# Patient Record
Sex: Male | Born: 1990 | Race: Black or African American | Hispanic: No | Marital: Single | Smoking: Never smoker
Health system: Southern US, Community
[De-identification: ages and names within clinical notes are randomized; demographics above are authoritative.]

## PROBLEM LIST (undated history)

## (undated) DIAGNOSIS — J45909 Unspecified asthma, uncomplicated: Secondary | ICD-10-CM

## (undated) DIAGNOSIS — R51 Headache: Secondary | ICD-10-CM

## (undated) DIAGNOSIS — M549 Dorsalgia, unspecified: Secondary | ICD-10-CM

## (undated) DIAGNOSIS — F319 Bipolar disorder, unspecified: Secondary | ICD-10-CM

## (undated) DIAGNOSIS — I10 Essential (primary) hypertension: Secondary | ICD-10-CM

## (undated) HISTORY — PX: BACK SURGERY: SHX140

## (undated) HISTORY — PX: TESTICLE SURGERY: SHX794

---
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
## 2013-10-17 ENCOUNTER — Emergency Department (HOSPITAL_BASED_OUTPATIENT_CLINIC_OR_DEPARTMENT_OTHER): Payer: Medicare Other

## 2013-10-17 ENCOUNTER — Encounter (HOSPITAL_BASED_OUTPATIENT_CLINIC_OR_DEPARTMENT_OTHER): Payer: Self-pay | Admitting: Emergency Medicine

## 2013-10-17 ENCOUNTER — Observation Stay (HOSPITAL_BASED_OUTPATIENT_CLINIC_OR_DEPARTMENT_OTHER)
Admission: EM | Admit: 2013-10-17 | Discharge: 2013-10-19 | Disposition: A | Discharge status: Home / Self Care | Payer: Medicare Other | Attending: Internal Medicine | Admitting: Internal Medicine

## 2013-10-17 DIAGNOSIS — I10 Essential (primary) hypertension: Secondary | ICD-10-CM | POA: Diagnosis not present

## 2013-10-17 DIAGNOSIS — R51 Headache: Secondary | ICD-10-CM | POA: Diagnosis present

## 2013-10-17 DIAGNOSIS — Z23 Encounter for immunization: Secondary | ICD-10-CM | POA: Diagnosis not present

## 2013-10-17 DIAGNOSIS — R519 Headache, unspecified: Secondary | ICD-10-CM | POA: Diagnosis present

## 2013-10-17 DIAGNOSIS — R079 Chest pain, unspecified: Secondary | ICD-10-CM | POA: Diagnosis not present

## 2013-10-17 DIAGNOSIS — G43109 Migraine with aura, not intractable, without status migrainosus: Principal | ICD-10-CM | POA: Insufficient documentation

## 2013-10-17 HISTORY — DX: Headache: R51

## 2013-10-17 HISTORY — DX: Unspecified asthma, uncomplicated: J45.909

## 2013-10-17 MED ORDER — SODIUM CHLORIDE 0.9 % IV SOLN
Freq: Once | INTRAVENOUS | Status: AC
  Administered 2013-10-17: 10 mL/h via INTRAVENOUS

## 2013-10-17 MED ORDER — DEXAMETHASONE SODIUM PHOSPHATE 4 MG/ML IJ SOLN
INTRAMUSCULAR | Status: AC
  Administered 2013-10-17: 10 mg
  Filled 2013-10-17: qty 3

## 2013-10-17 MED ORDER — DIPHENHYDRAMINE HCL 50 MG/ML IJ SOLN
12.5000 mg | Freq: Once | INTRAMUSCULAR | Status: AC
  Administered 2013-10-17: 12.5 mg via INTRAVENOUS
  Filled 2013-10-17: qty 1

## 2013-10-17 MED ORDER — METOCLOPRAMIDE HCL 5 MG/ML IJ SOLN
10.0000 mg | Freq: Once | INTRAMUSCULAR | Status: AC
  Administered 2013-10-17: 10 mg via INTRAVENOUS
  Filled 2013-10-17: qty 2

## 2013-10-17 MED ORDER — DEXAMETHASONE SODIUM PHOSPHATE 10 MG/ML IJ SOLN: 10.0000 mg | Freq: Once | INTRAMUSCULAR | Status: DC

## 2013-10-17 MED ORDER — LORAZEPAM 2 MG/ML IJ SOLN
INTRAMUSCULAR | Status: AC
Start: 2013-10-17 — End: 2013-10-18
  Administered 2013-10-18: 1 mg
  Filled 2013-10-17: qty 1

## 2013-10-17 MED ORDER — LORAZEPAM 2 MG/ML IJ SOLN: 1.0000 mg | Freq: Once | INTRAMUSCULAR | Status: DC

## 2013-10-17 NOTE — ED Notes (Signed)
Of note the patient has swelling with a puss like drainage to his right breast. Patient states that he has been treating it at home with a band aid. Cellulitis noted.

## 2013-10-17 NOTE — ED Provider Notes (Signed)
CSN: [id number]     Arrival date & time 10/17/13  2141 History   First MD Initiated Contact with Patient 10/17/13 2210     Chief Complaint  Patient presents with  . Headache     (Consider location/radiation/quality/duration/timing/severity/associated sxs/prior Treatment) Patient is a 23 y.o. male presenting with headaches. The history is provided by the patient. No language interpreter was used.  Headache Pain location:  R parietal Quality:  Sharp Radiates to:  Does not radiate Chronicity:  New Similar to prior headaches: no   Associated symptoms: numbness   Associated symptoms: no abdominal pain, no congestion, no fever, no nausea and no sinus pressure   Associated symptoms comment:  He reports a headache that started 3 days ago that presents as a sharp shooting pain described as "electrical shock" that lasts a few seconds and resolves. The pain will occur 2 or three times in rapid succession and then will go away for about an hour before returning in the same pattern. No nausea, vomiting, visual changes. Tonight he had numbness on the left side of his body with onset of headache and that has persisted since the headache went away. He denies weakness.    History reviewed. No pertinent past medical history. History reviewed. No pertinent past surgical history. No family history on file. History  Substance Use Topics  . Smoking status: Never Smoker   . Smokeless tobacco: Not on file  . Alcohol Use: No    Review of Systems  Constitutional: Negative for fever and chills.  HENT: Negative for congestion and sinus pressure.   Respiratory: Negative.  Negative for shortness of breath.   Cardiovascular: Negative.  Negative for chest pain.  Gastrointestinal: Negative.  Negative for nausea and abdominal pain.  Musculoskeletal: Negative.   Skin: Negative.  Negative for rash.  Neurological: Positive for numbness and headaches. Negative for facial asymmetry, speech difficulty and weakness.       Allergies  Review of patient's allergies indicates no known allergies.  Home Medications   Prior to Admission medications   Not on File   BP 129/96  Pulse 88  Temp(Src) 99.1 F (37.3 C) (Oral)  Resp 18  Ht  (1.93 m)  Wt 311 lb (141.069 kg)  BMI 37.87 kg/m2  SpO2 100% Physical Exam  Constitutional: He is oriented to person, place, and time. He appears well-developed and well-nourished.  HENT:  Head: Normocephalic and atraumatic.  No scalp tenderness.   Eyes: Pupils are equal, round, and reactive to light.  Neck: Normal range of motion. Neck supple.  Cardiovascular: Normal rate and regular rhythm.   Pulmonary/Chest: Effort normal and breath sounds normal.  Abdominal: Soft. Bowel sounds are normal. There is no tenderness. There is no rebound and no guarding.  Musculoskeletal: Normal range of motion.  Neurological: He is alert and oriented to person, place, and time.  CN's 3-12 grossly intact. There is no lateralizing weakness appreciated. Speech clear and focused. No deficits of coordination.   Skin: Skin is warm and dry. No rash noted.  Psychiatric: He has a normal mood and affect.    ED Course  Procedures (including critical care time) Labs Review Labs Reviewed - No data to display  Imaging Review No results found.   EKG Interpretation   Date/Time:  Monday October 17 2013 21:56:55 EDT Ventricular Rate:  79 PR Interval:  152 QRS Duration: 100 QT Interval:  350 QTC Calculation: 401 R Axis:   -15 Text Interpretation:  Normal sinus rhythm Minimal voltage  criteria for  LVH, may be normal variant Nonspecific T wave abnormality changes noted  Confirmed by HARRISON  MD, FORREST ([id number]) on 10/17/2013 10:05:00 PM      MDM   Final diagnoses:  None   1. Headache 2. Numbness  He is currently without any headache. Will obtain CT head. Dr. Romeo Apple has examined the patient.  CT head negative. Medications given for symptom control. Discussed with Dr.  Cyril Mourning (neuro-hospitalist, [facility]) who recommends admission for pain control and to get MRI/MRA in the morning.  The patient prefers [facility]. Discussed with the hospitalist, Dr. Selinda Flavin who will accept the patient but requests CTA head and Sed Rate/CRP added to blood studies. Plan is to call her back once these studies are done for her to accept patient for transfer.     Arnoldo Hooker, PA-C 10/17/13 2359

## 2013-10-17 NOTE — ED Notes (Signed)
Headache off and on since yesterday. He was seen at [facility] on same day for syncope. They told him is BP was elevated and to see his MD. He was not admitted to the hospital. Here today with continued headache, chest pain, and numbness on the left side of his body. He is ambulatory to triage.

## 2013-10-18 ENCOUNTER — Emergency Department (HOSPITAL_BASED_OUTPATIENT_CLINIC_OR_DEPARTMENT_OTHER): Payer: Medicare Other

## 2013-10-18 ENCOUNTER — Encounter (HOSPITAL_COMMUNITY): Payer: Self-pay | Admitting: Family Medicine

## 2013-10-18 DIAGNOSIS — I10 Essential (primary) hypertension: Secondary | ICD-10-CM

## 2013-10-18 DIAGNOSIS — R079 Chest pain, unspecified: Secondary | ICD-10-CM

## 2013-10-18 DIAGNOSIS — R51 Headache: Secondary | ICD-10-CM

## 2013-10-18 DIAGNOSIS — R519 Headache, unspecified: Secondary | ICD-10-CM | POA: Diagnosis present

## 2013-10-18 LAB — CBC WITH DIFFERENTIAL/PLATELET
BASOS PCT: 0 % (ref 0–1)
Basophils Absolute: 0 10*3/uL (ref 0.0–0.1)
Eosinophils Absolute: 0 10*3/uL (ref 0.0–0.7)
Eosinophils Relative: 0 % (ref 0–5)
HCT: 44.4 % (ref 39.0–52.0)
HEMOGLOBIN: 15.5 g/dL (ref 13.0–17.0)
LYMPHS ABS: 3.6 10*3/uL (ref 0.7–4.0)
Lymphocytes Relative: 37 % (ref 12–46)
MCH: 29.2 pg (ref 26.0–34.0)
MCHC: 34.9 g/dL (ref 30.0–36.0)
MCV: 83.6 fL (ref 78.0–100.0)
Monocytes Absolute: 0.8 10*3/uL (ref 0.1–1.0)
Monocytes Relative: 8 % (ref 3–12)
NEUTROS ABS: 5.2 10*3/uL (ref 1.7–7.7)
NEUTROS PCT: 55 % (ref 43–77)
Platelets: 288 10*3/uL (ref 150–400)
RBC: 5.31 MIL/uL (ref 4.22–5.81)
RDW: 12.6 % (ref 11.5–15.5)
WBC: 9.6 10*3/uL (ref 4.0–10.5)

## 2013-10-18 LAB — BASIC METABOLIC PANEL
Anion gap: 16 — ABNORMAL HIGH (ref 5–15)
BUN: 16 mg/dL (ref 6–23)
CHLORIDE: 98 meq/L (ref 96–112)
CO2: 25 mEq/L (ref 19–32)
Calcium: 9.5 mg/dL (ref 8.4–10.5)
Creatinine, Ser: 1.1 mg/dL (ref 0.50–1.35)
GLUCOSE: 127 mg/dL — AB (ref 70–99)
POTASSIUM: 3.5 meq/L — AB (ref 3.7–5.3)
Sodium: 139 mEq/L (ref 137–147)

## 2013-10-18 LAB — C-REACTIVE PROTEIN: CRP: 0.6 mg/dL — AB (ref <0.60)

## 2013-10-18 LAB — SEDIMENTATION RATE: SED RATE: 10 mm/h (ref 0–16)

## 2013-10-18 MED ORDER — GI COCKTAIL ~~LOC~~
30.0000 mL | Freq: Once | ORAL | Status: AC
  Administered 2013-10-18: 30 mL via ORAL
  Filled 2013-10-18: qty 30

## 2013-10-18 MED ORDER — SODIUM CHLORIDE 0.9 % IV SOLN: INTRAVENOUS | Status: DC

## 2013-10-18 MED ORDER — IOHEXOL 350 MG/ML SOLN
80.0000 mL | Freq: Once | INTRAVENOUS | Status: AC | PRN
  Administered 2013-10-18: 80 mL via INTRAVENOUS

## 2013-10-18 MED ORDER — METOCLOPRAMIDE HCL 5 MG/ML IJ SOLN
5.0000 mg | Freq: Three times a day (TID) | INTRAMUSCULAR | Status: DC
  Filled 2013-10-18: qty 2

## 2013-10-18 MED ORDER — INFLUENZA VAC SPLIT QUAD 0.5 ML IM SUSY
0.5000 mL | PREFILLED_SYRINGE | INTRAMUSCULAR | Status: AC
  Administered 2013-10-19: 0.5 mL via INTRAMUSCULAR
  Filled 2013-10-18: qty 0.5

## 2013-10-18 MED ORDER — ISOMETHEPTENE-APAP-DICHLORAL 65-325-100 MG PO CAPS
2.0000 | ORAL_CAPSULE | ORAL | Status: DC | PRN
  Administered 2013-10-18: 2 via ORAL
  Filled 2013-10-18 (×4): qty 2

## 2013-10-18 MED ORDER — ALUM & MAG HYDROXIDE-SIMETH 200-200-20 MG/5ML PO SUSP: 15.0000 mL | ORAL | Status: DC | PRN

## 2013-10-18 NOTE — ED Provider Notes (Signed)
Medical screening examination/treatment/procedure(s) were conducted as a shared visit with non-physician practitioner(s) and myself.  I personally evaluated the patient during the encounter.   EKG Interpretation   Date/Time:  Monday October 17 2013 21:56:55 EDT Ventricular Rate:  79 PR Interval:  152 QRS Duration: 100 QT Interval:  350 QTC Calculation: 401 R Axis:   -15 Text Interpretation:  Normal sinus rhythm Minimal voltage criteria for  LVH, may be normal variant Nonspecific T wave abnormality changes noted  Confirmed by Haruko Mersch  MD, Arneda Sappington ([id number]) on 10/17/2013 10:05:00 PM      I interviewed and examined the patient. Lungs are CTAB. Cardiac exam wnl. Abdomen soft. HA intermittent. CT neg, but given left sided sx will plan for admission for more detailed imaging.   Purvis SheffieldForrest Delvina Mizzell, MD 10/18/13 [phone]

## 2013-10-18 NOTE — ED Provider Notes (Signed)
Discussed with Dr. Lowell GuitarPowell at [facility] who stated he would accept patient if there were beds available. Staff was informed that to not be available until later this morning. At this point patient and family decided they would like to be transferred to [facility]. Discussed with Dr. Allena KatzPatel. He advised we consult in the neurology. Dr. Cyril Mourningamillo agrees with plan to transfer to [facility] for [facility] and workup for new onset headache with left-sided weakness and numbness.  Upon reexamination of patient, patient's headache is resolved. He states his numbness is resolved. He has a normal bilateral upper extremity strength. His left lower extremity though still feels "heavy". There is mild drifting compared to the right.  We'll transfer to [facility] for complete workup. Dr Allena KatzPatel is accepting MD  Loren Raceravid Chanley Mcenery, MD 10/18/13 [phone]

## 2013-10-18 NOTE — ED Notes (Signed)
Patient is anxious and holding his chest, writhing around in the bed stating no more needles and no more medications. PA to the bedside and orders carried out.

## 2013-10-18 NOTE — H&P (Addendum)
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  states that he is having migraines all of his life but is non-any controlling medication for this or on any of 40 medication. States that the pain is a stabbing and is to collect pain that lasts for 3035 minutes a time every few minutes he has exacerbations. Localization of the headache is in the right post part of the head sometimes in the left. He has no nausea vomiting associated with it however a change in weather has changed the intensity of his migraines. He sometimes has related food intolerances that causes as well. No issues with bright lights or with sounds no phonophobia. He doesn't take anything for this. He was told that one of his recent emergency room visit he had htn He also says he has chest pain which came on the same time that he had a headache.  present numbness in her shoulder t 1-2 days ago mainly at night he presented to the emergency room with 8/10 headache he is given something IV which helped the pain in the head and the numbness completely went away.   Review of Systems: The patient denies  Fever Chills Cough Cold History Nausea Vomiting Blurred vision Double vision Unilateral weakness   History reviewed. No pertinent past medical history. History reviewed. No pertinent past surgical history. Social History:  History   Social History Narrative  . No narrative on file    No Known Allergies  Family History  Problem Relation Age of Onset  . Heart attack       Prior to Admission medications   Not on File   Physical Exam: Filed  Vitals:   10/17/13 2148 10/18/13 0536 10/18/13 0702  BP: 129/96 130/64 119/99  Pulse: 88 96 89  Temp: 99.1 F (37.3 C) 98.3 F (36.8 C) 98.6 F (37 C)  TempSrc: Oral Oral Oral  Resp: 18 18 20   Height: 6\' 4"  (1.93 m)    Weight: 141.069 kg (311 lb)    SpO2: 100% 100% 99%     General:  EOMI NCAT PERRLA   Eyes: No icterus no pallor   ENT:  HEENT normal, smile symmetric , Mallampati 2   Neck: Soft supple no JVD   Cardiovascular: S1-S2 no murmur rub or gallop   Respiratory:  clinically clear  Abdomen:  soft nontender nondistended no rebound   Skin: No lower extremity edema  Musculoskeletal: Range of motion   Psychiatric: Pleasant euthymic   Neurologic:  grossly intact moves all 4 limbs equally 5/5 power, reflexes 2/3  Sensory intact  Finger-nose-finger is normal, cerebellar signs negative  Labs on Admission:  Basic Metabolic Panel:  Recent Labs Lab 10/18/13 0001  NA 139  K 3.5*  CL 98  CO2 25  GLUCOSE 127*  BUN 16  CREATININE 1.10  CALCIUM 9.5   Liver Function Tests: No results found for this basename: AST, ALT, ALKPHOS, BILITOT, PROT, ALBUMIN,  in the last 168 hours No results found for this basename: LIPASE, AMYLASE,  in the last 168 hours No results found for this basename: AMMONIA,  in the last 168 hours CBC:  Recent Labs Lab 10/18/13 0001  WBC 9.6  NEUTROABS 5.2  HGB 15.5  HCT 44.4  MCV 83.6  PLT 288   Cardiac Enzymes: No results found for this basename: CKTOTAL, CKMB, CKMBINDEX, TROPONINI,  in the last 168 hours  BNP (last 3 results) No results found for this basename: PROBNP,  in the last 8760 hours CBG: No results found for this basename: GLUCAP,  in the last 168 hours  Radiological Exams on Admission: Ct Angio Head W/cm &/or Wo Cm  10/18/2013   CLINICAL DATA:  Severe headache, left arm  EXAM: CT ANGIOGRAPHY HEAD  TECHNIQUE: Multidetector CT imaging of the head was performed using the standard protocol during bolus administration of  intravenous contrast. Multiplanar CT image reconstructions and MIPs were obtained to evaluate the vascular anatomy.  CONTRAST:  80mL OMNIPAQUE IOHEXOL 350 MG/ML SOLN  COMPARISON:  Prior CT from 10/17/13.  FINDINGS: ANTERIOR CIRCULATION:  The visualized portions of the distal cervical internal carotid arteries are widely patent with antegrade flow. The petrous, cavernous, and supra clinoid segments are symmetric in caliber bilaterally. A1 segments, anterior communicating artery, and anterior cerebral arteries are widely patent. Middle cerebral arteries are widely patent with antegrade flow without high-grade flow-limiting stenosis or proximal branch occlusion. Distal MCA branches are symmetric bilaterally. No intracranial aneurysm within the anterior circulation.  POSTERIOR CIRCULATION:  The vertebral arteries are widely patent with antegrade flow. The posterior inferior cerebral arteries are normal. Vertebrobasilar junction and basilar artery are widely patent with antegrade flow without evidence of basilar tip stenosis or aneurysm. Posterior cerebral arteries are normal bilaterally. The superior cerebellar arteries and anterior inferior cerebellar arteries are widely patent bilaterally. No intracranial aneurysm within the posterior circulation.  IMPRESSION: Normal CTA of the brain.   Electronically Signed   By: Rise Mu M.D.   On: 10/18/2013 02:09   Ct Head Wo Contrast  10/17/2013   CLINICAL DATA:  Severe headache 2 days.  EXAM: CT HEAD WITHOUT CONTRAST  TECHNIQUE: Contiguous axial images were obtained from the base of the skull through the vertex without intravenous contrast.  COMPARISON:  10/28/2004 and MRI brain 02/19/2012.  FINDINGS: Ventricles, cisterns and other CSF spaces are within normal. There is no mass, mass effect, shift of midline structures or acute hemorrhage. No evidence of acute infarction remaining bones and soft tissues are within normal.  IMPRESSION: No acute intracranial  findings.   Electronically Signed   By: Elberta Fortis M.D.   On: 10/17/2013 23:02    EKG: Independently reviewed. None performed   Assessment/Plan Active Problems:   Headache with left-sided numbness-patient has already had a CT angiogram of the head which does not show any acute findings. I believe this is just a complicated migraine.  We will trial medication Midrin which contains a muscle relaxant and the sympathomimetic which may help the pain. In addition we can also repeat some Reglan IV for now and reassess in the morning. I do not believe he needs any further neurological workup and I will defer to neurology starting a triptan vs. having him followup as an outpatient for further management   HTN (hypertension)-pre-hypertension-patient should monitor salt in diet as well as other modifiable risk factors. 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 Nevertheless he should be counseled regarding weight   Chest pain-a nonspecific and probably just chest wall pain. We'll monitor him during hospital stay   Time spent:  45   Mahala Menghini [facility] Pager [phone]   If 7PM-7AM, please contact night-coverage [email or web address] Password [facility] 10/18/2013, 8:31 AM

## 2013-10-18 NOTE — Progress Notes (Signed)
Received pt from [facility] via [facility] link. A &O x4, no c/o pain. Pt placed on tele with initial reading of SR. Call bell within reach.

## 2013-10-19 ENCOUNTER — Observation Stay (HOSPITAL_COMMUNITY): Payer: Medicare Other

## 2013-10-19 LAB — COMPREHENSIVE METABOLIC PANEL
ALBUMIN: 3.6 g/dL (ref 3.5–5.2)
ALT: 24 U/L (ref 0–53)
AST: 18 U/L (ref 0–37)
Alkaline Phosphatase: 57 U/L (ref 39–117)
Anion gap: 12 (ref 5–15)
BUN: 9 mg/dL (ref 6–23)
CALCIUM: 9.1 mg/dL (ref 8.4–10.5)
CO2: 27 mEq/L (ref 19–32)
CREATININE: 0.91 mg/dL (ref 0.50–1.35)
Chloride: 101 mEq/L (ref 96–112)
GFR calc Af Amer: >90 mL/min (ref >90)
GFR calc non Af Amer: >90 mL/min (ref >90)
Glucose, Bld: 105 mg/dL — ABNORMAL HIGH (ref 70–99)
Potassium: 4 mEq/L (ref 3.7–5.3)
Sodium: 140 mEq/L (ref 137–147)
TOTAL PROTEIN: 7.1 g/dL (ref 6.0–8.3)
Total Bilirubin: 0.3 mg/dL (ref 0.3–1.2)

## 2013-10-19 LAB — RAPID URINE DRUG SCREEN, HOSP PERFORMED
AMPHETAMINES: NOT DETECTED
BARBITURATES: POSITIVE — AB
Benzodiazepines: NOT DETECTED
Cocaine: NOT DETECTED
Opiates: NOT DETECTED
TETRAHYDROCANNABINOL: NOT DETECTED

## 2013-10-19 LAB — CBC
HEMATOCRIT: 44.1 % (ref 39.0–52.0)
HEMOGLOBIN: 15.5 g/dL (ref 13.0–17.0)
MCH: 29.9 pg (ref 26.0–34.0)
MCHC: 35.1 g/dL (ref 30.0–36.0)
MCV: 85 fL (ref 78.0–100.0)
PLATELETS: 270 10*3/uL (ref 150–400)
RBC: 5.19 MIL/uL (ref 4.22–5.81)
RDW: 12.6 % (ref 11.5–15.5)
WBC: 9.8 10*3/uL (ref 4.0–10.5)

## 2013-10-19 MED ORDER — NORTRIPTYLINE HCL 25 MG PO CAPS: 25.0000 mg | ORAL_CAPSULE | Freq: Every day | ORAL | Status: DC

## 2013-10-19 MED ORDER — METHOCARBAMOL 1000 MG/10ML IJ SOLN: 500.0000 mg | Freq: Three times a day (TID) | INTRAVENOUS | Status: DC | PRN

## 2013-10-19 MED ORDER — ISOMETHEPTENE-APAP-DICHLORAL 65-325-100 MG PO CAPS: 2.0000 | ORAL_CAPSULE | ORAL | Status: DC | PRN

## 2013-10-19 NOTE — Progress Notes (Signed)
Patient refusing to wear telemetry complaining that they are hurting him. Explained the importance of the monitoring previously during the night when pt requested for them to be removed.

## 2013-10-19 NOTE — Treatment Plan (Signed)
      Darren RogerMark A Oconnor DOB: [birth year]  Date of admission: 10/17/2013    Patient was admitted for complicated migraine on 9/28 Okay to go back to school and continue regular activities Avoid lifting weight greater than 10 pounds Avoid football and high impact sports for one month  Please call for further questions Darren Oconnor   Darren Oconnor  [facility]  [phone]

## 2013-10-19 NOTE — Progress Notes (Signed)
Patient ready for discharge.  Education and paperwork complete and prescriptions given to patient.  IV removed and note to return to school given.  Lance BoschAnna Pacey Altizer, RN

## 2013-10-19 NOTE — Discharge Summary (Signed)
Physician Discharge Summary  Darren Oconnor MRN: [id number] DOB/AGE: [birth year] 23 y.o.  PCP: Houston Siren, MD   Admit date: 10/17/2013 Discharge date: 10/19/2013  Discharge Diagnoses:  Complicated migraine    HTN (hypertension)   Chest pain  Follow recommendations next and follow up with PCP in 5-7 days Followup with neurology at [facility] in one to 2 weeks    Medication List    STOP taking these medications       ibuprofen 200 MG tablet  Commonly known as:  ADVIL,MOTRIN      TAKE these medications       isometheptene-acetaminophen-dichloralphenazone 65-325-100 MG capsule  Commonly known as:  MIDRIN  Take 2 capsules by mouth every 4 (four) hours as needed for migraine or headache. Maximum 5 capsules in 12 hours for migraine headaches, 8 capsules in 24 hours for tension headaches.     nortriptyline 25 MG capsule  Commonly known as:  PAMELOR  Take 1 capsule (25 mg total) by mouth at bedtime.        Discharge Condition: Stable Disposition:    Consults:  None  Significant Diagnostic Studies: Ct Angio Head W/cm &/or Wo Cm  10/18/2013   CLINICAL DATA:  Severe headache, left arm  EXAM: CT ANGIOGRAPHY HEAD  TECHNIQUE: Multidetector CT imaging of the head was performed using the standard protocol during bolus administration of intravenous contrast. Multiplanar CT image reconstructions and MIPs were obtained to evaluate the vascular anatomy.  CONTRAST:  35m OMNIPAQUE IOHEXOL 350 MG/ML SOLN  COMPARISON:  Prior CT from 10/17/13.  FINDINGS: ANTERIOR CIRCULATION:  The visualized portions of the distal cervical internal carotid arteries are widely patent with antegrade flow. The petrous, cavernous, and supra clinoid segments are symmetric in caliber bilaterally. A1 segments, anterior communicating artery, and anterior cerebral arteries are widely patent. Middle cerebral arteries are widely patent with antegrade flow without high-grade flow-limiting stenosis or proximal  branch occlusion. Distal MCA branches are symmetric bilaterally. No intracranial aneurysm within the anterior circulation.  POSTERIOR CIRCULATION:  The vertebral arteries are widely patent with antegrade flow. The posterior inferior cerebral arteries are normal. Vertebrobasilar junction and basilar artery are widely patent with antegrade flow without evidence of basilar tip stenosis or aneurysm. Posterior cerebral arteries are normal bilaterally. The superior cerebellar arteries and anterior inferior cerebellar arteries are widely patent bilaterally. No intracranial aneurysm within the posterior circulation.  IMPRESSION: Normal CTA of the brain.   Electronically Signed   By: BJeannine BogaM.D.   On: 10/18/2013 02:09   Ct Head Wo Contrast  10/17/2013   CLINICAL DATA:  Severe headache 2 days.  EXAM: CT HEAD WITHOUT CONTRAST  TECHNIQUE: Contiguous axial images were obtained from the base of the skull through the vertex without intravenous contrast.  COMPARISON:  10/28/2004 and MRI brain 02/19/2012.  FINDINGS: Ventricles, cisterns and other CSF spaces are within normal. There is no mass, mass effect, shift of midline structures or acute hemorrhage. No evidence of acute infarction remaining bones and soft tissues are within normal.  IMPRESSION: No acute intracranial findings.   Electronically Signed   By: DMarin OlpM.D.   On: 10/17/2013 23:02       Microbiology: No results found for this or any previous visit (from the past 240 hour(s)).   Labs: Results for orders placed during the hospital encounter of 10/17/13 (from the past 48 hour(s))  CBC WITH DIFFERENTIAL     Status: None   Collection Time    10/18/13 12:01  AM      Result Value Ref Range   WBC 9.6  4.0 - 10.5 K/uL   RBC 5.31  4.22 - 5.81 MIL/uL   Hemoglobin 15.5  13.0 - 17.0 g/dL   HCT 44.4  39.0 - 52.0 %   MCV 83.6  78.0 - 100.0 fL   MCH 29.2  26.0 - 34.0 pg   MCHC 34.9  30.0 - 36.0 g/dL   RDW 12.6  11.5 - 15.5 %   Platelets  288  150 - 400 K/uL   Neutrophils Relative % 55  43 - 77 %   Neutro Abs 5.2  1.7 - 7.7 K/uL   Lymphocytes Relative 37  12 - 46 %   Lymphs Abs 3.6  0.7 - 4.0 K/uL   Monocytes Relative 8  3 - 12 %   Monocytes Absolute 0.8  0.1 - 1.0 K/uL   Eosinophils Relative 0  0 - 5 %   Eosinophils Absolute 0.0  0.0 - 0.7 K/uL   Basophils Relative 0  0 - 1 %   Basophils Absolute 0.0  0.0 - 0.1 K/uL  BASIC METABOLIC PANEL     Status: Abnormal   Collection Time    10/18/13 12:01 AM      Result Value Ref Range   Sodium 139  137 - 147 mEq/L   Potassium 3.5 (*) 3.7 - 5.3 mEq/L   Chloride 98  96 - 112 mEq/L   CO2 25  19 - 32 mEq/L   Glucose, Bld 127 (*) 70 - 99 mg/dL   BUN 16  6 - 23 mg/dL   Creatinine, Ser 1.10  0.50 - 1.35 mg/dL   Calcium 9.5  8.4 - 10.5 mg/dL   GFR calc non Af Amer >90  >90 mL/min   GFR calc Af Amer >90  >90 mL/min   Comment: (NOTE)     The eGFR has been calculated using the CKD EPI equation.     This calculation has not been validated in all clinical situations.     eGFR's persistently <90 mL/min signify possible Chronic Kidney     Disease.   Anion gap 16 (*) 5 - 15  SEDIMENTATION RATE     Status: None   Collection Time    10/18/13 12:30 AM      Result Value Ref Range   Sed Rate 10  0 - 16 mm/hr  C-REACTIVE PROTEIN     Status: Abnormal   Collection Time    10/18/13 12:30 AM      Result Value Ref Range   CRP 0.6 (*) <0.60 mg/dL   Comment: Performed at [facility]     Status: Abnormal   Collection Time    10/19/13  6:19 AM      Result Value Ref Range   Sodium 140  137 - 147 mEq/L   Potassium 4.0  3.7 - 5.3 mEq/L   Chloride 101  96 - 112 mEq/L   CO2 27  19 - 32 mEq/L   Glucose, Bld 105 (*) 70 - 99 mg/dL   BUN 9  6 - 23 mg/dL   Creatinine, Ser 0.91  0.50 - 1.35 mg/dL   Calcium 9.1  8.4 - 10.5 mg/dL   Total Protein 7.1  6.0 - 8.3 g/dL   Albumin 3.6  3.5 - 5.2 g/dL   AST 18  0 - 37 U/L   ALT 24  0 - 53 U/L   Alkaline  Phosphatase 57  39 - 117 U/L   Total Bilirubin 0.3  0.3 - 1.2 mg/dL   GFR calc non Af Amer >90  >90 mL/min   GFR calc Af Amer >90  >90 mL/min   Comment: (NOTE)     The eGFR has been calculated using the CKD EPI equation.     This calculation has not been validated in all clinical situations.     eGFR's persistently <90 mL/min signify possible Chronic Kidney     Disease.   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 He usually takes ibuprofen occasionally for his headaches.Localization of the headache is in the right post part of the head sometimes in the left. He has no nausea vomiting associated with it however a change in weather has changed the intensity of his migraines.  He sometimes has related food intolerances that causes as well. No issues with bright lights or with sounds no phonophobia present numbness in his shoulder for 1-2 days ago associated with 8/10 headache    HOSPITAL COURSE:  Complicated migraine CT of head without contrast and CT angiogram of the head and neck were negative Discussed with Dr. Katherine Roan neurology over the phone He recommend MRI of the brain If MRI is negative the patient can go home today Patient is ambulating and does not seem to have any residual deficits Neurology recommended starting the patient on nortriptyline 25 mg each bedtime Patient has also been prescribed midrin as needed    Discharge Exam: Blood pressure  141/63, pulse 83, temperature 97.7 F (36.5 C), temperature source Oral, resp. rate 20, height _0  (1.93 m), weight 141.069 kg (311 lb), SpO2 96.00%.  General: EOMI NCAT PERRLA Eyes: No icterus no pallor ENT: HEENT normal, smile symmetric , Mallampati 2 Neck: Soft supple no JVD Cardiovascular: S1-S2 no murmur rub or gallop Respiratory: clinically clear Abdomen: soft nontender nondistended no rebound Skin: No lower extremity edema Musculoskeletal: Range of motion Psychiatric: Pleasant euthymic Neurologic: grossly intact moves all 4 limbs equally 5/5 power, reflexes 2/3          Follow-up Information   Follow up with [facility], MD. Schedule an appointment as soon as possible for a visit in 1 week.   Specialty:  Family Medicine   Contact information:   [address] [place] [address] [phone]       Follow up with Neurology at [facility]. Schedule an appointment as soon as possible for a visit in 1 week.      SignedReyne Dumas 10/19/2013, 11:25 AM

## 2013-11-03 ENCOUNTER — Encounter (HOSPITAL_BASED_OUTPATIENT_CLINIC_OR_DEPARTMENT_OTHER): Payer: Self-pay | Admitting: Emergency Medicine

## 2013-11-03 ENCOUNTER — Emergency Department (HOSPITAL_BASED_OUTPATIENT_CLINIC_OR_DEPARTMENT_OTHER): Payer: Medicare Other

## 2013-11-03 ENCOUNTER — Emergency Department (HOSPITAL_BASED_OUTPATIENT_CLINIC_OR_DEPARTMENT_OTHER)
Admission: EM | Admit: 2013-11-03 | Discharge: 2013-11-03 | Disposition: A | Discharge status: Home / Self Care | Payer: Medicare Other | Attending: Emergency Medicine | Admitting: Emergency Medicine

## 2013-11-03 DIAGNOSIS — R1033 Periumbilical pain: Secondary | ICD-10-CM | POA: Diagnosis present

## 2013-11-03 DIAGNOSIS — K625 Hemorrhage of anus and rectum: Secondary | ICD-10-CM | POA: Diagnosis not present

## 2013-11-03 DIAGNOSIS — R1013 Epigastric pain: Secondary | ICD-10-CM | POA: Diagnosis not present

## 2013-11-03 DIAGNOSIS — K5900 Constipation, unspecified: Secondary | ICD-10-CM | POA: Diagnosis not present

## 2013-11-03 DIAGNOSIS — J45909 Unspecified asthma, uncomplicated: Secondary | ICD-10-CM | POA: Insufficient documentation

## 2013-11-03 DIAGNOSIS — Z79899 Other long term (current) drug therapy: Secondary | ICD-10-CM | POA: Diagnosis not present

## 2013-11-03 LAB — CBC WITH DIFFERENTIAL/PLATELET
Basophils Absolute: 0 10*3/uL (ref 0.0–0.1)
Basophils Relative: 0 % (ref 0–1)
Eosinophils Absolute: 0.5 10*3/uL (ref 0.0–0.7)
Eosinophils Relative: 6 % — ABNORMAL HIGH (ref 0–5)
HCT: 45.8 % (ref 39.0–52.0)
HEMOGLOBIN: 15.8 g/dL (ref 13.0–17.0)
LYMPHS ABS: 2.3 10*3/uL (ref 0.7–4.0)
LYMPHS PCT: 32 % (ref 12–46)
MCH: 29 pg (ref 26.0–34.0)
MCHC: 34.5 g/dL (ref 30.0–36.0)
MCV: 84 fL (ref 78.0–100.0)
Monocytes Absolute: 0.6 10*3/uL (ref 0.1–1.0)
Monocytes Relative: 9 % (ref 3–12)
NEUTROS ABS: 3.7 10*3/uL (ref 1.7–7.7)
NEUTROS PCT: 53 % (ref 43–77)
PLATELETS: 288 10*3/uL (ref 150–400)
RBC: 5.45 MIL/uL (ref 4.22–5.81)
RDW: 12.6 % (ref 11.5–15.5)
WBC: 7.1 10*3/uL (ref 4.0–10.5)

## 2013-11-03 LAB — COMPREHENSIVE METABOLIC PANEL
ALK PHOS: 82 U/L (ref 39–117)
ALT: 33 U/L (ref 0–53)
AST: 22 U/L (ref 0–37)
Albumin: 3.7 g/dL (ref 3.5–5.2)
Anion gap: 12 (ref 5–15)
BUN: 6 mg/dL (ref 6–23)
CHLORIDE: 100 meq/L (ref 96–112)
CO2: 27 mEq/L (ref 19–32)
Calcium: 9.4 mg/dL (ref 8.4–10.5)
Creatinine, Ser: 0.9 mg/dL (ref 0.50–1.35)
GFR calc non Af Amer: >90 mL/min (ref >90)
GLUCOSE: 124 mg/dL — AB (ref 70–99)
POTASSIUM: 4.2 meq/L (ref 3.7–5.3)
Sodium: 139 mEq/L (ref 137–147)
Total Bilirubin: 0.2 mg/dL — ABNORMAL LOW (ref 0.3–1.2)
Total Protein: 7.1 g/dL (ref 6.0–8.3)

## 2013-11-03 NOTE — ED Provider Notes (Signed)
CSN: [id number]     Arrival date & time 11/03/13  [id number] History   First MD Initiated Contact with Patient 11/03/13 0544     Chief Complaint  Patient presents with  . Abdominal Pain     (Consider location/radiation/quality/duration/timing/severity/associated sxs/prior Treatment) HPI Comments: Patient is a 23 year old male with no significant past medical history. He was recently admitted for workup of a complex migraine. His workup was unremarkable including MRI of the brain and CT angiogram head and neck. He was discharged a little over a week ago and has been taking Midrin since that time. He tells me he has not had a bowel movement in a week. This morning he attempted to go and was only able to pass a small amount of bloody stool.  Patient is a 23 y.o. male presenting with abdominal pain. The history is provided by the patient.  Abdominal Pain Pain location:  Periumbilical Pain quality: cramping   Pain radiates to:  Does not radiate Pain severity:  Moderate Onset quality:  Sudden Duration:  3 hours Timing:  Constant Progression:  Worsening Chronicity:  New Relieved by:  Nothing Worsened by:  Nothing tried Ineffective treatments:  None tried   Past Medical History  Diagnosis Date  . Headache(784.0)   . Asthma    Past Surgical History  Procedure Laterality Date  . Testicle surgery     Family History  Problem Relation Age of Onset  . Heart attack     History  Substance Use Topics  . Smoking status: Never Smoker   . Smokeless tobacco: Never Used  . Alcohol Use: No    Review of Systems  Gastrointestinal: Positive for abdominal pain.  All other systems reviewed and are negative.     Allergies  Review of patient's allergies indicates no known allergies.  Home Medications   Prior to Admission medications   Medication Sig Start Date End Date Taking? Authorizing Provider  isometheptene-acetaminophen-dichloralphenazone (MIDRIN) 65-325-100 MG capsule Take 2  capsules by mouth every 4 (four) hours as needed for migraine or headache. Maximum 5 capsules in 12 hours for migraine headaches, 8 capsules in 24 hours for tension headaches. 10/19/13   Richarda OverlieNayana Abrol, MD  nortriptyline (PAMELOR) 25 MG capsule Take 1 capsule (25 mg total) by mouth at bedtime. 10/19/13   Richarda OverlieNayana Abrol, MD   BP 152/98  Pulse 88  Temp(Src) 98.9 F (37.2 C) (Oral)  Resp 20  Ht 6\' 4"  (1.93 m)  Wt 317 lb (143.79 kg)  BMI 38.60 kg/m2  SpO2 99% Physical Exam  Nursing note and vitals reviewed. Constitutional: He is oriented to person, place, and time. He appears well-developed and well-nourished. No distress.  HENT:  Head: Normocephalic and atraumatic.  Neck: Normal range of motion. Neck supple.  Cardiovascular: Normal rate, regular rhythm and normal heart sounds.   No murmur heard. Pulmonary/Chest: Effort normal and breath sounds normal. No respiratory distress. He has no wheezes.  Abdominal: Soft. Bowel sounds are normal. He exhibits no distension. There is tenderness. There is no rebound and no guarding.  There is mild tenderness to the periumbilical and epigastric region with no rebound and no guarding.  Musculoskeletal: Normal range of motion. He exhibits no edema.  Neurological: He is alert and oriented to person, place, and time.  Skin: Skin is warm and dry. He is not diaphoretic.    ED Course  Procedures (including critical care time) Labs Review Labs Reviewed  CBC WITH DIFFERENTIAL  COMPREHENSIVE METABOLIC PANEL    Imaging Review  No results found.   EKG Interpretation None      MDM   Final diagnoses:  None    Patient is a 23 year old male who presents with complaints of constipation and bloody stool. He is also having some abdominal cramping. This all started after he was admitted for a complex migraine and treated with different medications but I suspect that lead to his constipation. His laboratory studies reveal a normal hemoglobin and x-rays which  reveal no evidence for bowel obstruction or blockage. He will be treated with magnesium citrate and followup with GI. He understands to return if his symptoms substantially worsen or change.    Geoffery Lyonsouglas Favor Kreh, MD 11/03/13 [phone]

## 2013-11-03 NOTE — ED Notes (Addendum)
Pt reports abd pain with blood on stool this am. Pt reports no BM x 1 week. Pt reports feeling bloated.

## 2013-11-03 NOTE — Discharge Instructions (Signed)
Magnesium citrate: Drink entire 10 ounce bottle mixed with equal parts Sprite or Gatorade.  Followup with gastroenterology to discuss a possible colonoscopy if your bleeding persists. The contact information for [facility] has been provided on this discharge summary.   Constipation Constipation is when a person has fewer than three bowel movements a week, has difficulty having a bowel movement, or has stools that are dry, hard, or larger than normal. As people grow older, constipation is more common. If you try to fix constipation with medicines that make you have a bowel movement (laxatives), the problem may get worse. Long-term laxative use may cause the muscles of the colon to become weak. A low-fiber diet, not taking in enough fluids, and taking certain medicines may make constipation worse.  CAUSES   Certain medicines, such as antidepressants, pain medicine, iron supplements, antacids, and water pills.   Certain diseases, such as diabetes, irritable bowel syndrome (IBS), thyroid disease, or depression.   Not drinking enough water.   Not eating enough fiber-rich foods.   Stress or travel.   Lack of physical activity or exercise.   Ignoring the urge to have a bowel movement.   Using laxatives too much.  SIGNS AND SYMPTOMS   Having fewer than three bowel movements a week.   Straining to have a bowel movement.   Having stools that are hard, dry, or larger than normal.   Feeling full or bloated.   Pain in the lower abdomen.   Not feeling relief after having a bowel movement.  DIAGNOSIS  Your health care provider will take a medical history and perform a physical exam. Further testing may be done for severe constipation. Some tests may include:  A barium enema X-ray to examine your rectum, colon, and, sometimes, your small intestine.   A sigmoidoscopy to examine your lower colon.   A colonoscopy to examine your entire colon. TREATMENT  Treatment will  depend on the severity of your constipation and what is causing it. Some dietary treatments include drinking more fluids and eating more fiber-rich foods. Lifestyle treatments may include regular exercise. If these diet and lifestyle recommendations do not help, your health care provider may recommend taking over-the-counter laxative medicines to help you have bowel movements. Prescription medicines may be prescribed if over-the-counter medicines do not work.  HOME CARE INSTRUCTIONS   Eat foods that have a lot of fiber, such as fruits, vegetables, whole grains, and beans.  Limit foods high in fat and processed sugars, such as french fries, hamburgers, cookies, candies, and soda.   A fiber supplement may be added to your diet if you cannot get enough fiber from foods.   Drink enough fluids to keep your urine clear or pale yellow.   Exercise regularly or as directed by your health care provider.   Go to the restroom when you have the urge to go. Do not hold it.   Only take over-the-counter or prescription medicines as directed by your health care provider. Do not take other medicines for constipation without talking to your health care provider first.  SEEK IMMEDIATE MEDICAL CARE IF:   You have bright red blood in your stool.   Your constipation lasts for more than 4 days or gets worse.   You have abdominal or rectal pain.   You have thin, pencil-like stools.   You have unexplained weight loss. MAKE SURE YOU:   Understand these instructions.  Will watch your condition.  Will get help right away if you are not  doing well or get worse. Document Released: 10/05/2003 Document Revised: 01/11/2013 Document Reviewed: 10/18/2012 [facility] Patient Information 2015 [employer] [place]. This information is not intended to replace advice given to you by your health care provider. Make sure you discuss any questions you have with your health care provider.  Bloody Stools Bloody stools  often mean that there is a problem in the digestive tract. Your caregiver may use the term "melena" to describe black, tarry, and bad smelling stools or "hematochezia" to describe red or maroon-colored stools. Blood seen in the stool can be caused by bleeding anywhere along the intestinal tract.  A black stool usually means that blood is coming from the upper part of the gastrointestinal tract (esophagus, stomach, or small bowel). Passing maroon-colored stools or bright red blood usually means that blood is coming from lower down in the large bowel or the rectum. However, sometimes massive bleeding in the stomach or small intestine can cause bright red bloody stools.  Consuming black licorice, lead, iron pills, medicines containing bismuth subsalicylate, or blueberries can also cause black stools. Your caregiver can test black stools to see if blood is present. It is important that the cause of the bleeding be found. Treatment can then be started, and the problem can be corrected. Rectal bleeding may not be serious, but you should not assume everything is okay until you know the cause.It is very important to follow up with your caregiver or a specialist in gastrointestinal problems. CAUSES  Blood in the stools can come from various underlying causes.Often, the cause is not found during your first visit. Testing is often needed to discover the cause of bleeding in the gastrointestinal tract. Causes range from simple to serious or even life-threatening.Possible causes include:  Hemorrhoids.These are veins that are full of blood (engorged) in the rectum. They cause pain, inflammation, and may bleed.  Anal fissures.These are areas of painful tearing which may bleed. They are often caused by passing hard stool.  Diverticulosis.These are pouches that form on the colon over time, with age, and may bleed significantly.  Diverticulitis.This is inflammation in areas with diverticulosis. It can cause pain,  fever, and bloody stools, although bleeding is rare.  Proctitis and colitis. These are inflamed areas of the rectum or colon. They may cause pain, fever, and bloody stools.  Polyps and cancer. Colon cancer is a leading cause of preventable cancer death.It often starts out as precancerous polyps that can be removed during a colonoscopy, preventing progression into cancer. Sometimes, polyps and cancer may cause rectal bleeding.  Gastritis and ulcers.Bleeding from the upper gastrointestinal tract (near the stomach) may travel through the intestines and produce black, sometimes tarry, often bad smelling stools. In certain cases, if the bleeding is fast enough, the stools may not be black, but red and the condition may be life-threatening. SYMPTOMS  You may have stools that are bright red and bloody, that are normal color with blood on them, or that are dark black and tarry. In some cases, you may only have blood in the toilet bowl. Any of these cases need medical care. You may also have:  Pain at the anus or anywhere in the rectum.  Lightheadedness or feeling faint.  Extreme weakness.  Nausea or vomiting.  Fever. DIAGNOSIS Your caregiver may use the following methods to find the cause of your bleeding:  Taking a medical history. Age is important. Older people tend to develop polyps and cancer more often. If there is anal pain and a hard, large  stool associated with bleeding, a tear of the anus may be the cause. If blood drips into the toilet after a bowel movement, bleeding hemorrhoids may be the problem. The color and frequency of the bleeding are additional considerations. In most cases, the medical history provides clues, but seldom the final answer.  A visual and finger (digital) exam. Your caregiver will inspect the anal area, looking for tears and hemorrhoids. A finger exam can provide information when there is tenderness or a growth inside. In men, the prostate is also  examined.  Endoscopy. Several types of small, long scopes (endoscopes) are used to view the colon.  In the office, your caregiver may use a rigid, or more commonly, a flexible viewing sigmoidoscope. This exam is called flexible sigmoidoscopy. It is performed in 5 to 10 minutes.  A more thorough exam is accomplished with a colonoscope. It allows your caregiver to view the entire 5 to 6 foot long colon. Medicine to help you relax (sedative) is usually given for this exam. Frequently, a bleeding lesion may be present beyond the reach of the sigmoidoscope. So, a colonoscopy may be the best exam to start with. Both exams are usually done on an outpatient basis. This means the patient does not stay overnight in the hospital or surgery center.  An upper endoscopy may be needed to examine your stomach. Sedation is used and a flexible endoscope is put in your mouth, down to your stomach.  A barium enema X-ray. This is an X-ray exam. It uses liquid barium inserted by enema into the rectum. This test alone may not identify an actual bleeding point. X-rays highlight abnormal shadows, such as those made by lumps (tumors), diverticuli, or colitis. TREATMENT  Treatment depends on the cause of your bleeding.   For bleeding from the stomach or colon, the caregiver doing your endoscopy or colonoscopy may be able to stop the bleeding as part of the procedure.  Inflammation or infection of the colon can be treated with medicines.  Many rectal problems can be treated with creams, suppositories, or warm baths.  Surgery is sometimes needed.  Blood transfusions are sometimes needed if you have lost a lot of blood.  For any bleeding problem, let your caregiver know if you take aspirin or other blood thinners regularly. HOME CARE INSTRUCTIONS   Take any medicines exactly as prescribed.  Keep your stools soft by eating a diet high in fiber. Prunes (1 to 3 a day) work well for many people.  Drink enough water and  fluids to keep your urine clear or pale yellow.  Take sitz baths if advised. A sitz bath is when you sit in a bathtub with warm water for 10 to 15 minutes to soak, soothe, and cleanse the rectal area.  If enemas or suppositories are advised, be sure you know how to use them. Tell your caregiver if you have problems with this.  Monitor your bowel movements to look for signs of improvement or worsening. SEEK MEDICAL CARE IF:   You do not improve in the time expected.  Your condition worsens after initial improvement.  You develop any new symptoms. SEEK IMMEDIATE MEDICAL CARE IF:   You develop severe or prolonged rectal bleeding.  You vomit blood.  You feel weak or faint.  You have a fever. MAKE SURE YOU:  Understand these instructions.  Will watch your condition.  Will get help right away if you are not doing well or get worse. Document Released: 12/27/2001 Document Revised: 03/31/2011  Document Reviewed: 05/24/2010 [facility] Patient Information 2015 [place]. This information is not intended to replace advice given to you by your health care provider. Make sure you discuss any questions you have with your health care provider.

## 2014-04-06 ENCOUNTER — Emergency Department (HOSPITAL_BASED_OUTPATIENT_CLINIC_OR_DEPARTMENT_OTHER): Payer: Medicare Other

## 2014-04-06 ENCOUNTER — Emergency Department (HOSPITAL_BASED_OUTPATIENT_CLINIC_OR_DEPARTMENT_OTHER)
Admission: EM | Admit: 2014-04-06 | Discharge: 2014-04-06 | Disposition: A | Discharge status: Home / Self Care | Payer: Medicare Other | Attending: Emergency Medicine | Admitting: Emergency Medicine

## 2014-04-06 ENCOUNTER — Encounter (HOSPITAL_BASED_OUTPATIENT_CLINIC_OR_DEPARTMENT_OTHER): Payer: Self-pay

## 2014-04-06 DIAGNOSIS — Z79899 Other long term (current) drug therapy: Secondary | ICD-10-CM | POA: Insufficient documentation

## 2014-04-06 DIAGNOSIS — R109 Unspecified abdominal pain: Secondary | ICD-10-CM | POA: Diagnosis present

## 2014-04-06 DIAGNOSIS — I10 Essential (primary) hypertension: Secondary | ICD-10-CM | POA: Insufficient documentation

## 2014-04-06 DIAGNOSIS — J45901 Unspecified asthma with (acute) exacerbation: Secondary | ICD-10-CM | POA: Diagnosis not present

## 2014-04-06 DIAGNOSIS — R1013 Epigastric pain: Secondary | ICD-10-CM

## 2014-04-06 HISTORY — DX: Essential (primary) hypertension: I10

## 2014-04-06 LAB — CBC WITH DIFFERENTIAL/PLATELET
BASOS ABS: 0 10*3/uL (ref 0.0–0.1)
Basophils Relative: 0 % (ref 0–1)
Eosinophils Absolute: 0.7 10*3/uL (ref 0.0–0.7)
Eosinophils Relative: 8 % — ABNORMAL HIGH (ref 0–5)
HEMATOCRIT: 46.3 % (ref 39.0–52.0)
Hemoglobin: 16.2 g/dL (ref 13.0–17.0)
LYMPHS PCT: 40 % (ref 12–46)
Lymphs Abs: 3.3 10*3/uL (ref 0.7–4.0)
MCH: 28.8 pg (ref 26.0–34.0)
MCHC: 35 g/dL (ref 30.0–36.0)
MCV: 82.4 fL (ref 78.0–100.0)
MONO ABS: 0.6 10*3/uL (ref 0.1–1.0)
MONOS PCT: 8 % (ref 3–12)
Neutro Abs: 3.6 10*3/uL (ref 1.7–7.7)
Neutrophils Relative %: 44 % (ref 43–77)
Platelets: 276 10*3/uL (ref 150–400)
RBC: 5.62 MIL/uL (ref 4.22–5.81)
RDW: 13.1 % (ref 11.5–15.5)
WBC: 8.3 10*3/uL (ref 4.0–10.5)

## 2014-04-06 LAB — COMPREHENSIVE METABOLIC PANEL
ALBUMIN: 4.1 g/dL (ref 3.5–5.2)
ALK PHOS: 65 U/L (ref 39–117)
ALT: 30 U/L (ref 0–53)
AST: 25 U/L (ref 0–37)
Anion gap: 8 (ref 5–15)
BUN: 11 mg/dL (ref 6–23)
CALCIUM: 9.2 mg/dL (ref 8.4–10.5)
CO2: 27 mmol/L (ref 19–32)
Chloride: 102 mmol/L (ref 96–112)
Creatinine, Ser: 1 mg/dL (ref 0.50–1.35)
GFR calc Af Amer: >90 mL/min (ref >90)
GFR calc non Af Amer: >90 mL/min (ref >90)
GLUCOSE: 106 mg/dL — AB (ref 70–99)
Potassium: 3.9 mmol/L (ref 3.5–5.1)
Sodium: 137 mmol/L (ref 135–145)
Total Bilirubin: 0.2 mg/dL — ABNORMAL LOW (ref 0.3–1.2)
Total Protein: 7.2 g/dL (ref 6.0–8.3)

## 2014-04-06 LAB — LIPASE, BLOOD: LIPASE: 27 U/L (ref 11–59)

## 2014-04-06 MED ORDER — SODIUM CHLORIDE 0.9 % IV SOLN: 80.0000 mg | Freq: Once | INTRAVENOUS | Status: DC

## 2014-04-06 MED ORDER — PANTOPRAZOLE SODIUM 40 MG IV SOLR
40.0000 mg | Freq: Once | INTRAVENOUS | Status: AC
  Administered 2014-04-06: 40 mg via INTRAVENOUS
  Filled 2014-04-06: qty 40

## 2014-04-06 MED ORDER — ALUM & MAG HYDROXIDE-SIMETH 200-200-20 MG/5ML PO SUSP
30.0000 mL | Freq: Once | ORAL | Status: AC
  Administered 2014-04-06: 30 mL via ORAL
  Filled 2014-04-06: qty 30

## 2014-04-06 MED ORDER — PANTOPRAZOLE SODIUM 40 MG IV SOLR: 40.0000 mg | Freq: Two times a day (BID) | INTRAVENOUS | Status: DC

## 2014-04-06 MED ORDER — IPRATROPIUM-ALBUTEROL 0.5-2.5 (3) MG/3ML IN SOLN
3.0000 mL | RESPIRATORY_TRACT | Status: DC
  Administered 2014-04-06: 3 mL via RESPIRATORY_TRACT

## 2014-04-06 MED ORDER — IPRATROPIUM-ALBUTEROL 0.5-2.5 (3) MG/3ML IN SOLN
RESPIRATORY_TRACT | Status: AC
  Administered 2014-04-06: 3 mL via RESPIRATORY_TRACT
  Filled 2014-04-06: qty 3

## 2014-04-06 MED ORDER — SODIUM CHLORIDE 0.9 % IV SOLN: 8.0000 mg/h | INTRAVENOUS | Status: DC

## 2014-04-06 NOTE — ED Notes (Signed)
MD at bedside. 

## 2014-04-06 NOTE — ED Provider Notes (Signed)
CSN: [id number]     Arrival date & time 04/06/14  0004 History   First MD Initiated Contact with Patient 04/06/14 [phone]     Chief Complaint  Patient presents with  . Abdominal Pain     (Consider location/radiation/quality/duration/timing/severity/associated sxs/prior Treatment) HPI This is a 24 year old male who developed abdominal pain yesterday. The pain is located in the epigastrium and is described as sharp. It comes and goes and has been as severe as a 9 out of 10 at its worst. It is worse with standing up and better when lying supine. He feels like his abdomen is distended. There has been no associated nausea or vomiting. He had diarrhea earlier in the week but none presently. The pain radiates into the chest. He has had shortness of breath and wheezing which have not been relieved by his albuterol inhaler.  Past Medical History  Diagnosis Date  . Headache(784.0)   . Asthma   . Hypertension    Past Surgical History  Procedure Laterality Date  . Testicle surgery     Family History  Problem Relation Age of Onset  . Heart attack     History  Substance Use Topics  . Smoking status: Never Smoker   . Smokeless tobacco: Never Used  . Alcohol Use: No    Review of Systems  All other systems reviewed and are negative.   Allergies  Review of patient's allergies indicates no known allergies.  Home Medications   Prior to Admission medications   Medication Sig Start Date End Date Taking? Authorizing Provider  isometheptene-acetaminophen-dichloralphenazone (MIDRIN) 65-325-100 MG capsule Take 2 capsules by mouth every 4 (four) hours as needed for migraine or headache. Maximum 5 capsules in 12 hours for migraine headaches, 8 capsules in 24 hours for tension headaches. 10/19/13   Richarda Overlie, MD  nortriptyline (PAMELOR) 25 MG capsule Take 1 capsule (25 mg total) by mouth at bedtime. 10/19/13   Richarda Overlie, MD   BP 141/107 mmHg  Pulse 87  Temp(Src) 97.4 F (36.3 C) (Oral)  Resp  18  Ht  (1.93 m)  Wt 317 lb (143.79 kg)  BMI 38.60 kg/m2  SpO2 98%   Physical Exam General: Well-developed, well-nourished male in no acute distress; appearance consistent with age of record HENT: normocephalic; atraumatic Eyes: pupils equal, round and reactive to light; extraocular muscles intact Neck: supple Heart: regular rate and rhythm Lungs: Decreased air movement bilaterally with faint inspiratory wheezes Abdomen: soft; nondistended; epigastric tenderness; no masses or hepatosplenomegaly; bowel sounds present Extremities: No deformity; full range of motion; pulses normal Neurologic: Awake, alert and oriented; motor function intact in all extremities and symmetric; no facial droop Skin: Warm and dry Psychiatric: Normal mood and affect    ED Course  Procedures (including critical care time)   EKG Interpretation   Date/Time:  Thursday April 06 2014 00:29:50 EDT Ventricular Rate:  94 PR Interval:  132 QRS Duration: 92 QT Interval:  338 QTC Calculation: 422 R Axis:   -14 Text Interpretation:  Normal sinus rhythm Nonspecific T wave abnormality  Abnormal ECG No significant change was found Confirmed by Read Drivers  MD, Jonny Ruiz  [phone]) on 04/06/2014 2:13:17 AM      MDM  Nursing notes and vitals signs, including pulse oximetry, reviewed.  Summary of this visit's results, reviewed by myself:  Labs:  Results for orders placed or performed during the hospital encounter of 04/06/14 (from the past 24 hour(s))  Lipase, blood     Status: None   Collection  Time: 04/06/14  2:29 AM  Result Value Ref Range   Lipase 27 11 - 59 U/L  CBC with Differential/Platelet     Status: Abnormal   Collection Time: 04/06/14  2:29 AM  Result Value Ref Range   WBC 8.3 4.0 - 10.5 K/uL   RBC 5.62 4.22 - 5.81 MIL/uL   Hemoglobin 16.2 13.0 - 17.0 g/dL   HCT 69.6 29.5 - 28.4 %   MCV 82.4 78.0 - 100.0 fL   MCH 28.8 26.0 - 34.0 pg   MCHC 35.0 30.0 - 36.0 g/dL   RDW 13.2 44.0 - 10.2 %    Platelets 276 150 - 400 K/uL   Neutrophils Relative % 44 43 - 77 %   Neutro Abs 3.6 1.7 - 7.7 K/uL   Lymphocytes Relative 40 12 - 46 %   Lymphs Abs 3.3 0.7 - 4.0 K/uL   Monocytes Relative 8 3 - 12 %   Monocytes Absolute 0.6 0.1 - 1.0 K/uL   Eosinophils Relative 8 (H) 0 - 5 %   Eosinophils Absolute 0.7 0.0 - 0.7 K/uL   Basophils Relative 0 0 - 1 %   Basophils Absolute 0.0 0.0 - 0.1 K/uL  Comprehensive metabolic panel     Status: Abnormal   Collection Time: 04/06/14  2:29 AM  Result Value Ref Range   Sodium 137 135 - 145 mmol/L   Potassium 3.9 3.5 - 5.1 mmol/L   Chloride 102 96 - 112 mmol/L   CO2 27 19 - 32 mmol/L   Glucose, Bld 106 (H) 70 - 99 mg/dL   BUN 11 6 - 23 mg/dL   Creatinine, Ser 7.25 0.50 - 1.35 mg/dL   Calcium 9.2 8.4 - 36.6 mg/dL   Total Protein 7.2 6.0 - 8.3 g/dL   Albumin 4.1 3.5 - 5.2 g/dL   AST 25 0 - 37 U/L   ALT 30 0 - 53 U/L   Alkaline Phosphatase 65 39 - 117 U/L   Total Bilirubin 0.2 (L) 0.3 - 1.2 mg/dL   GFR calc non Af Amer >90 >90 mL/min   GFR calc Af Amer >90 >90 mL/min   Anion gap 8 5 - 15    Imaging Studies: Dg Abd Acute W/chest  04/06/2014   CLINICAL DATA:  Acute onset of mid abdominal pain and diarrhea. Abdominal bloating. Shortness of breath. Initial encounter.  EXAM: ACUTE ABDOMEN SERIES (ABDOMEN 2 VIEW & CHEST 1 VIEW)  COMPARISON:  Chest and abdominal radiographs performed 11/03/2013  FINDINGS: The lungs are well-aerated and clear. There is no evidence of focal opacification, pleural effusion or pneumothorax. The cardiomediastinal silhouette is within normal limits.  The visualized bowel gas pattern is unremarkable. Scattered stool and air are seen within the colon; there is no evidence of small bowel dilatation to suggest obstruction. No free intra-abdominal air is identified on the provided upright view.  There is mild distention of the sigmoid colon, without definite evidence of sigmoid volvulus.  No acute osseous abnormalities are seen; the  sacroiliac joints are unremarkable in appearance.  IMPRESSION: 1. Mild distention of the sigmoid colon to 6.4 cm, without definite evidence of sigmoid volvulus. Small to moderate amount of stool noted in the colon. No free intra-abdominal air seen. 2. No acute cardiopulmonary process seen.   Electronically Signed   By: Roanna Raider M.D.   On: 04/06/2014 03:06   4:19 AM Lungs clear, patient breathing easier after DuoNeb treatment. Abdominal pain has improved significantly. Additional imaging at this time does  not appear to be indicated.     Paula LibraJohn Jerlean Peralta, MD 04/06/14 [phone]

## 2014-04-06 NOTE — ED Notes (Addendum)
Pt reports abdominal distention, bloating, heartburn, left sided chest pain, diarrhea yesterday, also reports shortness of breath - all s/s began yesterday. Pt states he used his albuterol inhaler PTA for wheeze/shortness of breath but no effect was noted.

## 2014-04-06 NOTE — Discharge Instructions (Signed)

## 2014-04-06 NOTE — ED Notes (Signed)
Pt given gingerale and crackers 

## 2014-06-16 ENCOUNTER — Emergency Department (HOSPITAL_BASED_OUTPATIENT_CLINIC_OR_DEPARTMENT_OTHER): Payer: Medicare Other

## 2014-06-16 ENCOUNTER — Encounter (HOSPITAL_BASED_OUTPATIENT_CLINIC_OR_DEPARTMENT_OTHER): Payer: Self-pay | Admitting: Emergency Medicine

## 2014-06-16 ENCOUNTER — Emergency Department (HOSPITAL_BASED_OUTPATIENT_CLINIC_OR_DEPARTMENT_OTHER)
Admission: EM | Admit: 2014-06-16 | Discharge: 2014-06-17 | Disposition: A | Discharge status: Home / Self Care | Payer: Medicare Other | Attending: Emergency Medicine | Admitting: Emergency Medicine

## 2014-06-16 DIAGNOSIS — R109 Unspecified abdominal pain: Secondary | ICD-10-CM | POA: Diagnosis present

## 2014-06-16 DIAGNOSIS — I10 Essential (primary) hypertension: Secondary | ICD-10-CM | POA: Diagnosis not present

## 2014-06-16 DIAGNOSIS — R0781 Pleurodynia: Secondary | ICD-10-CM | POA: Diagnosis not present

## 2014-06-16 DIAGNOSIS — J45901 Unspecified asthma with (acute) exacerbation: Secondary | ICD-10-CM | POA: Insufficient documentation

## 2014-06-16 DIAGNOSIS — M549 Dorsalgia, unspecified: Secondary | ICD-10-CM | POA: Insufficient documentation

## 2014-06-16 DIAGNOSIS — R1011 Right upper quadrant pain: Secondary | ICD-10-CM | POA: Insufficient documentation

## 2014-06-16 LAB — CBC WITH DIFFERENTIAL/PLATELET
BASOS PCT: 0 % (ref 0–1)
Basophils Absolute: 0 10*3/uL (ref 0.0–0.1)
Eosinophils Absolute: 0.3 10*3/uL (ref 0.0–0.7)
Eosinophils Relative: 3 % (ref 0–5)
HEMATOCRIT: 46.1 % (ref 39.0–52.0)
Hemoglobin: 16.1 g/dL (ref 13.0–17.0)
LYMPHS PCT: 37 % (ref 12–46)
Lymphs Abs: 3.7 10*3/uL (ref 0.7–4.0)
MCH: 28.8 pg (ref 26.0–34.0)
MCHC: 34.9 g/dL (ref 30.0–36.0)
MCV: 82.5 fL (ref 78.0–100.0)
MONO ABS: 0.8 10*3/uL (ref 0.1–1.0)
Monocytes Relative: 8 % (ref 3–12)
NEUTROS ABS: 5 10*3/uL (ref 1.7–7.7)
Neutrophils Relative %: 52 % (ref 43–77)
Platelets: 314 10*3/uL (ref 150–400)
RBC: 5.59 MIL/uL (ref 4.22–5.81)
RDW: 12.8 % (ref 11.5–15.5)
WBC: 9.9 10*3/uL (ref 4.0–10.5)

## 2014-06-16 LAB — COMPREHENSIVE METABOLIC PANEL
ALT: 26 U/L (ref 17–63)
AST: 24 U/L (ref 15–41)
Albumin: 4.4 g/dL (ref 3.5–5.0)
Alkaline Phosphatase: 52 U/L (ref 38–126)
Anion gap: 11 (ref 5–15)
BILIRUBIN TOTAL: 0.6 mg/dL (ref 0.3–1.2)
BUN: 6 mg/dL (ref 6–20)
CHLORIDE: 103 mmol/L (ref 101–111)
CO2: 26 mmol/L (ref 22–32)
Calcium: 9.1 mg/dL (ref 8.9–10.3)
Creatinine, Ser: 1.06 mg/dL (ref 0.61–1.24)
GFR calc Af Amer: >60 mL/min (ref >60)
GFR calc non Af Amer: >60 mL/min (ref >60)
GLUCOSE: 135 mg/dL — AB (ref 65–99)
Potassium: 3.8 mmol/L (ref 3.5–5.1)
Sodium: 140 mmol/L (ref 135–145)
Total Protein: 8 g/dL (ref 6.5–8.1)

## 2014-06-16 LAB — LIPASE, BLOOD: LIPASE: 30 U/L (ref 22–51)

## 2014-06-16 MED ORDER — KETOROLAC TROMETHAMINE 30 MG/ML IJ SOLN
30.0000 mg | Freq: Once | INTRAMUSCULAR | Status: AC
  Administered 2014-06-16: 30 mg via INTRAVENOUS
  Filled 2014-06-16: qty 1

## 2014-06-16 MED ORDER — MORPHINE SULFATE 4 MG/ML IJ SOLN
4.0000 mg | Freq: Once | INTRAMUSCULAR | Status: AC
  Administered 2014-06-16: 4 mg via INTRAVENOUS
  Filled 2014-06-16: qty 1

## 2014-06-16 NOTE — ED Notes (Signed)
Symptoms off and on for three weeks. Pt states right side upper abd pain going into back intermittently. Worse when laying down and then goes to move. States hard to catch breath sometimes with pain.

## 2014-06-16 NOTE — ED Notes (Signed)
Patient transported to X-ray 

## 2014-06-16 NOTE — ED Provider Notes (Signed)
CSN: [id number]     Arrival date & time 06/16/14  2258 History  This chart was scribed for Loren Raceravid Adelyna Brockman, MD by Phillis HaggisGabriella Gaje, ED Scribe. This patient was seen in room MH05/MH05 and patient care was started at 11:29 PM.   Chief Complaint  Patient presents with  . Abdominal Pain   The history is provided by the patient. No language interpreter was used.   HPI Comments: Darren Oconnor is a 24 y.o. male with a history of HTN who presents to the Emergency Department complaining of waxing and waning abdominal pain onset three weeks ago. He states that the pain is right sided and radiates to his back. He states that the pain will intensify for about 30 minutes, stating that it feels "like someone is stabbing me," then will ease. He reports certain movements and laying on his side will worsen the pain. He reports associated diarrhea in the past two days. His wife reports that she noticed SOB in the patient tonight, which is why they came to the ED. He states that pain also worsens with deep breathing.  He reports taking naproxen to no relief. He denies fever, chills, nausea, vomiting, hematuria, frequency, dysuria or appetite change. He reports history of testicle surgery. He denies history of abdominal surgeries, recent long travel, family history of blood clots, or any other medical problems.   Past Medical History  Diagnosis Date  . Headache(784.0)   . Asthma   . Hypertension    Past Surgical History  Procedure Laterality Date  . Testicle surgery     Family History  Problem Relation Age of Onset  . Heart attack     History  Substance Use Topics  . Smoking status: Never Smoker   . Smokeless tobacco: Never Used  . Alcohol Use: No    Review of Systems  Constitutional: Negative for fever and chills.  Respiratory: Positive for shortness of breath. Negative for cough.   Cardiovascular: Negative for chest pain.  Gastrointestinal: Positive for abdominal pain. Negative for nausea, vomiting,  diarrhea and constipation.  Genitourinary: Negative for dysuria, frequency and hematuria.  Musculoskeletal: Positive for back pain. Negative for myalgias, neck pain and neck stiffness.  Skin: Negative for rash and wound.  Neurological: Negative for dizziness, weakness, light-headedness, numbness and headaches.  All other systems reviewed and are negative.   Allergies  Review of patient's allergies indicates no known allergies.  Home Medications   Prior to Admission medications   Medication Sig Start Date End Date Taking? Authorizing Provider  ibuprofen (ADVIL,MOTRIN) 600 MG tablet Take 1 tablet (600 mg total) by mouth every 6 (six) hours as needed. 06/17/14   Loren Raceravid Sostenes Kauffmann, MD  isometheptene-acetaminophen-dichloralphenazone (MIDRIN) [id number] MG capsule Take 2 capsules by mouth every 4 (four) hours as needed for migraine or headache. Maximum 5 capsules in 12 hours for migraine headaches, 8 capsules in 24 hours for tension headaches. 10/19/13   Richarda OverlieNayana Abrol, MD  nortriptyline (PAMELOR) 25 MG capsule Take 1 capsule (25 mg total) by mouth at bedtime. 10/19/13   Richarda OverlieNayana Abrol, MD  traMADol (ULTRAM) 50 MG tablet Take 1 tablet (50 mg total) by mouth every 6 (six) hours as needed. 06/17/14   Loren Raceravid Kodi Guerrera, MD   BP 137/84 mmHg  Pulse 96  Temp(Src) 98.6 F (37 C) (Oral)  Resp 20  SpO2 98% Physical Exam  Constitutional: He is oriented to person, place, and time. He appears well-developed and well-nourished. No distress.  HENT:  Head: Normocephalic and atraumatic.  Mouth/Throat:  Oropharynx is clear and moist.  Eyes: EOM are normal. Pupils are equal, round, and reactive to light.  Neck: Normal range of motion. Neck supple.  Cardiovascular: Normal rate and regular rhythm.   Pulmonary/Chest: Effort normal and breath sounds normal. No respiratory distress. He has no wheezes. He has no rales. He exhibits no tenderness.  Abdominal: Soft. Bowel sounds are normal. He exhibits no distension and no  mass. There is tenderness (right upper quadrant and right inferior rib pain with palpation. No rebound or guarding. No deformity.). There is no rebound and no guarding.  Musculoskeletal: Normal range of motion. He exhibits no edema or tenderness.  No CVA tenderness bilaterally. Patient has no swelling to bilateral lower extremities. Distal pulses intact.  Neurological: He is alert and oriented to person, place, and time.  Skin: Skin is warm and dry. No rash noted. No erythema.  Psychiatric: He has a normal mood and affect. His behavior is normal.  Nursing note and vitals reviewed.   ED Course  Procedures (including critical care time) DIAGNOSTIC STUDIES: Oxygen Saturation is 98% on room air, normal by my interpretation.    COORDINATION OF CARE: 11:33 PM-Discussed treatment plan which includes morphine and Toradol via IV, labs and x-rays with pt at bedside and pt agreed to plan.   Labs Review Labs Reviewed  COMPREHENSIVE METABOLIC PANEL - Abnormal; Notable for the following:    Glucose, Bld 135 (*)    All other components within normal limits  CBC WITH DIFFERENTIAL/PLATELET  LIPASE, BLOOD  URINALYSIS, ROUTINE W REFLEX MICROSCOPIC (NOT AT [facility])  D-DIMER, QUANTITATIVE (NOT AT [facility])    Imaging Review Dg Abd Acute W/chest  06/17/2014   CLINICAL DATA:  Right upper quadrant pain for 3 weeks, radiating to the back intermittent  EXAM: DG ABDOMEN ACUTE W/ 1V CHEST  COMPARISON:  04/06/2014  FINDINGS: There is no evidence of dilated bowel loops or free intraperitoneal air. No radiopaque calculi or other significant radiographic abnormality is seen. Heart size and mediastinal contours are within normal limits. Both lungs are clear.  IMPRESSION: Negative abdominal radiographs.  No acute cardiopulmonary disease.   Electronically Signed   By: Marnee Spring M.D.   On: 06/17/2014 00:31   Ct Renal Stone Study  06/17/2014   CLINICAL DATA:  Initial evaluation for intermittent right sided and upper  abdominal pain going into back. History of asthma, hypertension.  EXAM: CT ABDOMEN AND PELVIS WITHOUT CONTRAST  TECHNIQUE: Multidetector CT imaging of the abdomen and pelvis was performed following the standard protocol without IV contrast.  COMPARISON:  Prior radiograph from earlier the same day.  FINDINGS: Mild dependent atelectasis present within the visualized right lung base. Visualized lungs are otherwise clear.  Limited noncontrast evaluation of the liver is unremarkable. Gallbladder within normal limits. No biliary dilatation. Spleen, adrenal glands, and pancreas demonstrate a normal unenhanced appearance.  Kidneys are equal in size without evidence of nephrolithiasis or hydronephrosis. No radiopaque calculi seen along the course of either renal collecting system. No hydroureter.  Stomach within normal limits. No evidence for bowel obstruction. No abnormal wall thickening or inflammatory fat stranding seen about the bowels. Appendix well visualized in the right lower quadrant and is of normal caliber and appearance without associated inflammatory changes to suggest acute appendicitis.  Bladder within normal limits.  Prostate normal.  No free air or fluid. No pathologically enlarged intra-abdominal or pelvic lymph nodes identified.  No acute osseous abnormality. No worrisome lytic or blastic osseous lesions.  IMPRESSION: 1. No CT  evidence for nephrolithiasis or obstructive uropathy. 2. No other acute intra-abdominal or pelvic process identified. 3. Normal appendix.   Electronically Signed   By: Rise Mu M.D.   On: 06/17/2014 05:13     EKG Interpretation None      MDM   Final diagnoses:  RUQ pain  Right flank pain    I personally performed the services described in this documentation, which was scribed in my presence. The recorded information has been reviewed and is accurate.   Patient resting comfortably after medication. Laboratory workup essentially normal. Plain film and CT  without cause for the patient's symptoms. Suspect may be musculoskeletal. He's been given return precautions has voiced understanding.  Loren Racer, MD 06/17/14 [phone]

## 2014-06-17 ENCOUNTER — Emergency Department (HOSPITAL_BASED_OUTPATIENT_CLINIC_OR_DEPARTMENT_OTHER): Payer: Medicare Other

## 2014-06-17 LAB — URINALYSIS, ROUTINE W REFLEX MICROSCOPIC
BILIRUBIN URINE: NEGATIVE
Glucose, UA: NEGATIVE mg/dL
Hgb urine dipstick: NEGATIVE
KETONES UR: NEGATIVE mg/dL
LEUKOCYTES UA: NEGATIVE
Nitrite: NEGATIVE
PH: 5.5 (ref 5.0–8.0)
PROTEIN: NEGATIVE mg/dL
Specific Gravity, Urine: 1.011 (ref 1.005–1.030)
UROBILINOGEN UA: 0.2 mg/dL (ref 0.0–1.0)

## 2014-06-17 LAB — D-DIMER, QUANTITATIVE (NOT AT ARMC): D DIMER QUANT: 0.29 ug{FEU}/mL (ref 0.00–0.48)

## 2014-06-17 MED ORDER — IBUPROFEN 600 MG PO TABS: 600.0000 mg | ORAL_TABLET | Freq: Four times a day (QID) | ORAL | Status: DC | PRN

## 2014-06-17 MED ORDER — TRAMADOL HCL 50 MG PO TABS: 50.0000 mg | ORAL_TABLET | Freq: Four times a day (QID) | ORAL | Status: DC | PRN

## 2014-06-17 NOTE — Discharge Instructions (Signed)

## 2014-06-17 NOTE — ED Notes (Signed)
Patient transported to CT 

## 2014-12-09 ENCOUNTER — Encounter (HOSPITAL_BASED_OUTPATIENT_CLINIC_OR_DEPARTMENT_OTHER): Payer: Self-pay | Admitting: *Deleted

## 2014-12-09 ENCOUNTER — Emergency Department (HOSPITAL_BASED_OUTPATIENT_CLINIC_OR_DEPARTMENT_OTHER)
Admission: EM | Admit: 2014-12-09 | Discharge: 2014-12-09 | Disposition: A | Discharge status: Home / Self Care | Payer: No Typology Code available for payment source | Attending: Emergency Medicine | Admitting: Emergency Medicine

## 2014-12-09 DIAGNOSIS — S3992XA Unspecified injury of lower back, initial encounter: Secondary | ICD-10-CM | POA: Diagnosis not present

## 2014-12-09 DIAGNOSIS — M545 Low back pain, unspecified: Secondary | ICD-10-CM

## 2014-12-09 DIAGNOSIS — Y92410 Unspecified street and highway as the place of occurrence of the external cause: Secondary | ICD-10-CM | POA: Diagnosis not present

## 2014-12-09 DIAGNOSIS — J45909 Unspecified asthma, uncomplicated: Secondary | ICD-10-CM | POA: Diagnosis not present

## 2014-12-09 DIAGNOSIS — Y9389 Activity, other specified: Secondary | ICD-10-CM | POA: Diagnosis not present

## 2014-12-09 DIAGNOSIS — I10 Essential (primary) hypertension: Secondary | ICD-10-CM | POA: Insufficient documentation

## 2014-12-09 DIAGNOSIS — Y998 Other external cause status: Secondary | ICD-10-CM | POA: Insufficient documentation

## 2014-12-09 DIAGNOSIS — S199XXA Unspecified injury of neck, initial encounter: Secondary | ICD-10-CM | POA: Diagnosis not present

## 2014-12-09 DIAGNOSIS — M542 Cervicalgia: Secondary | ICD-10-CM

## 2014-12-09 MED ORDER — METHOCARBAMOL 500 MG PO TABS: 500.0000 mg | ORAL_TABLET | Freq: Two times a day (BID) | ORAL | Status: DC

## 2014-12-09 MED ORDER — NAPROXEN 500 MG PO TABS: 500.0000 mg | ORAL_TABLET | Freq: Two times a day (BID) | ORAL | Status: DC

## 2014-12-09 NOTE — Discharge Instructions (Signed)
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 may increase bruising and bleeding. SEEK IMMEDIATE MEDICAL CARE IF:  You have numbness, tingling, or weakness in the arms or legs.  You develop severe headaches not relieved with medicine.  You have severe neck pain, especially tenderness in the middle of the back of your neck.  You have changes in bowel or bladder control.  There is  increasing pain in any area of the body.  You have shortness of breath, light-headedness, dizziness, or fainting.  You have chest pain.  You feel sick to your stomach (nauseous), throw up (vomit), or sweat.  You have increasing abdominal discomfort.  There is blood in your urine, stool, or vomit.  You have pain in your shoulder (shoulder strap areas).  You feel your symptoms are getting worse. MAKE SURE YOU:  Understand these instructions.  Will watch your condition.  Will get help right away if you are not doing well or get worse.   This information is not intended to replace advice given to you by your health care  provider. Make sure you discuss any questions you have with your health care provider.   Document Released: 01/06/2005 Document Revised: 01/27/2014 Document Reviewed: 06/05/2010 Elsevier Interactive Patient Education [employer]
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
## 2015-01-07 DIAGNOSIS — F70 Mild intellectual disabilities: Secondary | ICD-10-CM | POA: Insufficient documentation

## 2015-01-07 DIAGNOSIS — G43109 Migraine with aura, not intractable, without status migrainosus: Secondary | ICD-10-CM | POA: Insufficient documentation

## 2015-01-07 DIAGNOSIS — F319 Bipolar disorder, unspecified: Secondary | ICD-10-CM | POA: Insufficient documentation

## 2015-01-16 ENCOUNTER — Emergency Department (HOSPITAL_BASED_OUTPATIENT_CLINIC_OR_DEPARTMENT_OTHER)
Admission: EM | Admit: 2015-01-16 | Discharge: 2015-01-16 | Disposition: A | Discharge status: Home / Self Care | Payer: Medicare Other | Attending: Emergency Medicine | Admitting: Emergency Medicine

## 2015-01-16 ENCOUNTER — Encounter (HOSPITAL_BASED_OUTPATIENT_CLINIC_OR_DEPARTMENT_OTHER): Payer: Self-pay | Admitting: *Deleted

## 2015-01-16 DIAGNOSIS — H109 Unspecified conjunctivitis: Secondary | ICD-10-CM | POA: Diagnosis not present

## 2015-01-16 DIAGNOSIS — Z791 Long term (current) use of non-steroidal anti-inflammatories (NSAID): Secondary | ICD-10-CM | POA: Diagnosis not present

## 2015-01-16 DIAGNOSIS — I10 Essential (primary) hypertension: Secondary | ICD-10-CM | POA: Diagnosis not present

## 2015-01-16 DIAGNOSIS — Z79899 Other long term (current) drug therapy: Secondary | ICD-10-CM | POA: Insufficient documentation

## 2015-01-16 DIAGNOSIS — J45909 Unspecified asthma, uncomplicated: Secondary | ICD-10-CM | POA: Insufficient documentation

## 2015-01-16 DIAGNOSIS — R51 Headache: Secondary | ICD-10-CM | POA: Diagnosis not present

## 2015-01-16 DIAGNOSIS — H578 Other specified disorders of eye and adnexa: Secondary | ICD-10-CM | POA: Diagnosis present

## 2015-01-16 MED ORDER — SULFACETAMIDE SODIUM 10 % OP SOLN: 2.0000 [drp] | Freq: Four times a day (QID) | OPHTHALMIC | Status: DC

## 2015-01-16 NOTE — ED Provider Notes (Signed)
CSN: [id number]     Arrival date & time 01/16/15  1839 History  By signing my name below, I, Doreatha Martin, attest that this documentation has been prepared under the direction and in the presence of Geoffery Lyons, MD. Electronically Signed: Doreatha Martin, ED Scribe. 01/16/2015. 11:06 PM.    Chief Complaint  Patient presents with  . Eye Drainage   Patient is a 24 y.o. male presenting with eye problem. The history is provided by the patient. No language interpreter was used.  Eye Problem Location:  L eye Quality:  Aching Severity:  Moderate Onset quality:  Gradual Duration:  3 days Timing:  Constant Progression:  Worsening Chronicity:  New Context: not burn, not chemical exposure, not contact lens problem, not direct trauma, not foreign body, not scratch and not smoke exposure   Relieved by:  None tried Worsened by:  Contact Ineffective treatments:  None tried Associated symptoms: blurred vision, discharge, headaches and swelling   Risk factors: not exposed to pinkeye and no previous injury to eye    HPI Comments: Darren Oconnor is a 24 y.o. male with h/o asthma, HTN who presents to the Emergency Department complaining of moderate left eyelid swelling onset 2-3 days ago with associated drainage and pain, HA, blurry vision. He reports that his eye is tender to palpation. Pt denies having any sick contacts with similar symptoms. No h/o DM. He denies eye pain. No noted injury or trauma. NKDA.   Past Medical History  Diagnosis Date  . Headache(784.0)   . Asthma   . Hypertension    Past Surgical History  Procedure Laterality Date  . Testicle surgery     Family History  Problem Relation Age of Onset  . Heart attack     Social History  Substance Use Topics  . Smoking status: Never Smoker   . Smokeless tobacco: Never Used  . Alcohol Use: No    Review of Systems  Eyes: Positive for blurred vision and discharge. Negative for pain.       +eyelid pain  Neurological: Positive for  headaches.  All other systems reviewed and are negative.  Allergies  Review of patient's allergies indicates no known allergies.  Home Medications   Prior to Admission medications   Medication Sig Start Date End Date Taking? Authorizing Provider  isometheptene-acetaminophen-dichloralphenazone (MIDRIN) 65-325-100 MG capsule Take 2 capsules by mouth every 4 (four) hours as needed for migraine or headache. Maximum 5 capsules in 12 hours for migraine headaches, 8 capsules in 24 hours for tension headaches. 10/19/13   Richarda Overlie, MD  methocarbamol (ROBAXIN) 500 MG tablet Take 1 tablet (500 mg total) by mouth 2 (two) times daily. 12/09/14   Stevi Barrett, PA-C  naproxen (NAPROSYN) 500 MG tablet Take 1 tablet (500 mg total) by mouth 2 (two) times daily. 12/09/14   Stevi Barrett, PA-C  traMADol (ULTRAM) 50 MG tablet Take 1 tablet (50 mg total) by mouth every 6 (six) hours as needed. 06/17/14   Loren Racer, MD   BP 140/92 mmHg  Pulse 101  Temp(Src) 98.3 F (36.8 C) (Oral)  Resp 18  Ht  (1.93 m)  Wt 340 lb (154.223 kg)  BMI 41.40 kg/m2  SpO2 96% Physical Exam  Constitutional: He is oriented to person, place, and time. He appears well-developed and well-nourished.  HENT:  Head: Normocephalic and atraumatic.  Eyes: EOM are normal.  The left eyelid is swollen and tender. The conjunctiva is injected with purulent dc present. The eye otherwise appears  grossly normal. The pupil is reactive.   Neck: Normal range of motion.  Cardiovascular: Normal rate, regular rhythm, normal heart sounds and intact distal pulses.   Pulmonary/Chest: Effort normal and breath sounds normal. No respiratory distress.  Abdominal: Soft. He exhibits no distension. There is no tenderness.  Musculoskeletal: Normal range of motion.  Neurological: He is alert and oriented to person, place, and time.  Skin: Skin is warm and dry.  Psychiatric: He has a normal mood and affect. Judgment normal.  Nursing note and vitals  reviewed.   ED Course  Procedures (including critical care time) DIAGNOSTIC STUDIES: Oxygen Saturation is 96% on RA, adequate by my interpretation.    COORDINATION OF CARE: 11:04 PM Discussed treatment plan with pt at bedside and pt agreed to plan.   MDM   Final diagnoses:  Conjunctivitis of left eye    Patient presents with swelling of the left eyelid, redness of the conjunctiva, and purulent discharge from the eye. This appears to be a conjunctivitis. This will be treated with Bleph-10 eyedrops and when necessary return.  I personally performed the services described in this documentation, which was scribed in my presence. The recorded information has been reviewed and is accurate.       Geoffery Lyonsouglas Luisenrique Conran, MD 01/17/15 [phone]

## 2015-01-16 NOTE — ED Notes (Signed)
Pt c/o left eye redness drainage and swelling  X 2 weeks

## 2015-01-16 NOTE — ED Notes (Signed)
Pt complaining about wait time.  Told pt it is very busy tonight and doctor is working hard to see all pt.  Pt states he understands.

## 2015-01-16 NOTE — Discharge Instructions (Signed)
Bleph-10 eyedrops as prescribed.  Warm compresses to the left eye is frequently as possible for the next 2-3 days.  Return to the ER symptoms significant only worsen or change.   Bacterial Conjunctivitis Bacterial conjunctivitis, commonly called pink eye, is an inflammation of the clear membrane that covers the white part of the eye (conjunctiva). The inflammation can also happen on the underside of the eyelids. The blood vessels in the conjunctiva become inflamed, causing the eye to become red or pink. Bacterial conjunctivitis may spread easily from one eye to another and from person to person (contagious).  CAUSES  Bacterial conjunctivitis is caused by bacteria. The bacteria may come from your own skin, your upper respiratory tract, or from someone else with bacterial conjunctivitis. SYMPTOMS  The normally white color of the eye or the underside of the eyelid is usually pink or red. The pink eye is usually associated with irritation, tearing, and some sensitivity to light. Bacterial conjunctivitis is often associated with a thick, yellowish discharge from the eye. The discharge may turn into a crust on the eyelids overnight, which causes your eyelids to stick together. If a discharge is present, there may also be some blurred vision in the affected eye. DIAGNOSIS  Bacterial conjunctivitis is diagnosed by your caregiver through an eye exam and the symptoms that you report. Your caregiver looks for changes in the surface tissues of your eyes, which may point to the specific type of conjunctivitis. A sample of any discharge may be collected on a cotton-tip swab if you have a severe case of conjunctivitis, if your cornea is affected, or if you keep getting repeat infections that do not respond to treatment. The sample will be sent to a lab to see if the inflammation is caused by a bacterial infection and to see if the infection will respond to antibiotic medicines. TREATMENT   Bacterial conjunctivitis  is treated with antibiotics. Antibiotic eyedrops are most often used. However, antibiotic ointments are also available. Antibiotics pills are sometimes used. Artificial tears or eye washes may ease discomfort. HOME CARE INSTRUCTIONS   To ease discomfort, apply a cool, clean washcloth to your eye for 10-20 minutes, 3-4 times a day.  Gently wipe away any drainage from your eye with a warm, wet washcloth or a cotton ball.  Wash your hands often with soap and water. Use paper towels to dry your hands.  Do not share towels or washcloths. This may spread the infection.  Change or wash your pillowcase every day.  You should not use eye makeup until the infection is gone.  Do not operate machinery or drive if your vision is blurred.  Stop using contact lenses. Ask your caregiver how to sterilize or replace your contacts before using them again. This depends on the type of contact lenses that you use.  When applying medicine to the infected eye, do not touch the edge of your eyelid with the eyedrop bottle or ointment tube. SEEK IMMEDIATE MEDICAL CARE IF:   Your infection has not improved within 3 days after beginning treatment.  You had yellow discharge from your eye and it returns.  You have increased eye pain.  Your eye redness is spreading.  Your vision becomes blurred.  You have a fever or persistent symptoms for more than 2-3 days.  You have a fever and your symptoms suddenly get worse.  You have facial pain, redness, or swelling. MAKE SURE YOU:   Understand these instructions.  Will watch your condition.  Will get  help right away if you are not doing well or get worse.   This information is not intended to replace advice given to you by your health care provider. Make sure you discuss any questions you have with your health care provider.   Document Released: 01/06/2005 Document Revised: 01/27/2014 Document Reviewed: 06/09/2011 Elsevier Interactive Patient Education [employer].

## 2015-08-28 ENCOUNTER — Emergency Department (HOSPITAL_BASED_OUTPATIENT_CLINIC_OR_DEPARTMENT_OTHER)
Admission: EM | Admit: 2015-08-28 | Discharge: 2015-08-28 | Disposition: A | Discharge status: Home / Self Care | Payer: Medicare Other | Attending: Dermatology | Admitting: Dermatology

## 2015-08-28 ENCOUNTER — Encounter (HOSPITAL_BASED_OUTPATIENT_CLINIC_OR_DEPARTMENT_OTHER): Payer: Self-pay | Admitting: Emergency Medicine

## 2015-08-28 DIAGNOSIS — Z5321 Procedure and treatment not carried out due to patient leaving prior to being seen by health care provider: Secondary | ICD-10-CM | POA: Insufficient documentation

## 2015-08-28 DIAGNOSIS — J45909 Unspecified asthma, uncomplicated: Secondary | ICD-10-CM | POA: Diagnosis not present

## 2015-08-28 DIAGNOSIS — I10 Essential (primary) hypertension: Secondary | ICD-10-CM | POA: Insufficient documentation

## 2015-08-28 DIAGNOSIS — M549 Dorsalgia, unspecified: Secondary | ICD-10-CM | POA: Insufficient documentation

## 2015-08-28 LAB — URINALYSIS, ROUTINE W REFLEX MICROSCOPIC
Bilirubin Urine: NEGATIVE
GLUCOSE, UA: NEGATIVE mg/dL
HGB URINE DIPSTICK: NEGATIVE
Ketones, ur: NEGATIVE mg/dL
LEUKOCYTES UA: NEGATIVE
Nitrite: NEGATIVE
PH: 6.5 (ref 5.0–8.0)
PROTEIN: NEGATIVE mg/dL
SPECIFIC GRAVITY, URINE: 1.021 (ref 1.005–1.030)

## 2015-08-28 NOTE — ED Triage Notes (Signed)
Pt reports onset of back pain x 1 month started while at work but pt denies event or injury

## 2015-08-28 NOTE — ED Notes (Signed)
Pt left before md went to see pt

## 2015-08-28 NOTE — ED Notes (Signed)
C/o rt lower back into rt flank radiating to rt leg   Denies urinary sx

## 2018-12-15 DIAGNOSIS — F330 Major depressive disorder, recurrent, mild: Secondary | ICD-10-CM | POA: Insufficient documentation

## 2019-01-24 DIAGNOSIS — M5116 Intervertebral disc disorders with radiculopathy, lumbar region: Secondary | ICD-10-CM | POA: Insufficient documentation

## 2019-09-21 ENCOUNTER — Other Ambulatory Visit: Payer: Self-pay

## 2019-09-21 ENCOUNTER — Emergency Department (HOSPITAL_BASED_OUTPATIENT_CLINIC_OR_DEPARTMENT_OTHER): Payer: Medicare Other

## 2019-09-21 ENCOUNTER — Encounter (HOSPITAL_BASED_OUTPATIENT_CLINIC_OR_DEPARTMENT_OTHER): Payer: Self-pay | Admitting: Emergency Medicine

## 2019-09-21 DIAGNOSIS — U071 COVID-19: Secondary | ICD-10-CM | POA: Insufficient documentation

## 2019-09-21 DIAGNOSIS — Z5321 Procedure and treatment not carried out due to patient leaving prior to being seen by health care provider: Secondary | ICD-10-CM | POA: Diagnosis not present

## 2019-09-21 DIAGNOSIS — R0989 Other specified symptoms and signs involving the circulatory and respiratory systems: Secondary | ICD-10-CM | POA: Diagnosis present

## 2019-09-21 DIAGNOSIS — J45901 Unspecified asthma with (acute) exacerbation: Secondary | ICD-10-CM | POA: Diagnosis not present

## 2019-09-21 MED ORDER — ACETAMINOPHEN 500 MG PO TABS
1000.0000 mg | ORAL_TABLET | Freq: Once | ORAL | Status: AC
  Administered 2019-09-21: 1000 mg via ORAL

## 2019-09-21 MED ORDER — ACETAMINOPHEN 500 MG PO TABS
ORAL_TABLET | ORAL | Status: AC
  Filled 2019-09-21: qty 2

## 2019-09-21 NOTE — ED Triage Notes (Signed)
Pt c/o asthma exacerbation with congestion x 2-3 days.

## 2019-09-22 ENCOUNTER — Telehealth (HOSPITAL_BASED_OUTPATIENT_CLINIC_OR_DEPARTMENT_OTHER): Payer: Self-pay | Admitting: Emergency Medicine

## 2019-09-22 ENCOUNTER — Emergency Department (HOSPITAL_BASED_OUTPATIENT_CLINIC_OR_DEPARTMENT_OTHER)
Admission: EM | Admit: 2019-09-22 | Discharge: 2019-09-22 | Disposition: A | Discharge status: Home / Self Care | Payer: Medicare Other | Attending: Emergency Medicine | Admitting: Emergency Medicine

## 2019-09-22 LAB — SARS CORONAVIRUS 2 BY RT PCR (HOSPITAL ORDER, PERFORMED IN ~~LOC~~ HOSPITAL LAB): SARS Coronavirus 2: POSITIVE — AB

## 2019-09-22 NOTE — Telephone Encounter (Signed)
Attempted to call patient to advise of Covid positive results; no answer; no voicemail available.

## 2019-09-23 ENCOUNTER — Encounter: Payer: Self-pay | Admitting: Oncology

## 2019-09-23 ENCOUNTER — Telehealth: Payer: Self-pay | Admitting: Oncology

## 2019-09-23 NOTE — Telephone Encounter (Signed)
Called to Discuss with patient about Covid symptoms and the use of regeneron, a monoclonal antibody infusion for those with mild to moderate Covid symptoms and at a high risk of hospitalization.     Pt is qualified for this infusion at the [facility] infusion center due to co-morbid conditions and/or a member of an at-risk group.     Unable to reach pt. No vm available- Sent Mychart message  Past Medical History:  Diagnosis Date  . Asthma   . Headache(784.0)   . Hypertension      Mignon Pine, AGNP-C [phone] (Infusion Center Hotline)

## 2020-07-18 ENCOUNTER — Emergency Department (HOSPITAL_BASED_OUTPATIENT_CLINIC_OR_DEPARTMENT_OTHER): Payer: Medicare (Managed Care)

## 2020-07-18 ENCOUNTER — Other Ambulatory Visit: Payer: Self-pay

## 2020-07-18 ENCOUNTER — Emergency Department (HOSPITAL_BASED_OUTPATIENT_CLINIC_OR_DEPARTMENT_OTHER)
Admission: EM | Admit: 2020-07-18 | Discharge: 2020-07-18 | Disposition: A | Discharge status: Home / Self Care | Payer: Medicare (Managed Care) | Attending: Emergency Medicine | Admitting: Emergency Medicine

## 2020-07-18 ENCOUNTER — Encounter (HOSPITAL_BASED_OUTPATIENT_CLINIC_OR_DEPARTMENT_OTHER): Payer: Self-pay

## 2020-07-18 DIAGNOSIS — I10 Essential (primary) hypertension: Secondary | ICD-10-CM | POA: Insufficient documentation

## 2020-07-18 DIAGNOSIS — K219 Gastro-esophageal reflux disease without esophagitis: Secondary | ICD-10-CM | POA: Insufficient documentation

## 2020-07-18 DIAGNOSIS — R079 Chest pain, unspecified: Secondary | ICD-10-CM | POA: Diagnosis present

## 2020-07-18 DIAGNOSIS — J45909 Unspecified asthma, uncomplicated: Secondary | ICD-10-CM | POA: Insufficient documentation

## 2020-07-18 DIAGNOSIS — R0789 Other chest pain: Secondary | ICD-10-CM

## 2020-07-18 LAB — CBC WITH DIFFERENTIAL/PLATELET
Abs Immature Granulocytes: 0.01 10*3/uL (ref 0.00–0.07)
Basophils Absolute: 0 10*3/uL (ref 0.0–0.1)
Basophils Relative: 0 %
Eosinophils Absolute: 0.3 10*3/uL (ref 0.0–0.5)
Eosinophils Relative: 3 %
HCT: 42.6 % (ref 39.0–52.0)
Hemoglobin: 14.4 g/dL (ref 13.0–17.0)
Immature Granulocytes: 0 %
Lymphocytes Relative: 25 %
Lymphs Abs: 2.3 10*3/uL (ref 0.7–4.0)
MCH: 27.9 pg (ref 26.0–34.0)
MCHC: 33.8 g/dL (ref 30.0–36.0)
MCV: 82.6 fL (ref 80.0–100.0)
Monocytes Absolute: 0.5 10*3/uL (ref 0.1–1.0)
Monocytes Relative: 6 %
Neutro Abs: 6.3 10*3/uL (ref 1.7–7.7)
Neutrophils Relative %: 66 %
Platelets: 342 10*3/uL (ref 150–400)
RBC: 5.16 MIL/uL (ref 4.22–5.81)
RDW: 13.1 % (ref 11.5–15.5)
WBC: 9.5 10*3/uL (ref 4.0–10.5)
nRBC: 0 % (ref 0.0–0.2)

## 2020-07-18 LAB — COMPREHENSIVE METABOLIC PANEL
ALT: 19 U/L (ref 0–44)
AST: 20 U/L (ref 15–41)
Albumin: 3.9 g/dL (ref 3.5–5.0)
Alkaline Phosphatase: 51 U/L (ref 38–126)
Anion gap: 9 (ref 5–15)
BUN: 10 mg/dL (ref 6–20)
CO2: 25 mmol/L (ref 22–32)
Calcium: 8.8 mg/dL — ABNORMAL LOW (ref 8.9–10.3)
Chloride: 101 mmol/L (ref 98–111)
Creatinine, Ser: 1.27 mg/dL — ABNORMAL HIGH (ref 0.61–1.24)
GFR, Estimated: >60 mL/min (ref >60)
Glucose, Bld: 139 mg/dL — ABNORMAL HIGH (ref 70–99)
Potassium: 3.8 mmol/L (ref 3.5–5.1)
Sodium: 135 mmol/L (ref 135–145)
Total Bilirubin: 0.5 mg/dL (ref 0.3–1.2)
Total Protein: 7.6 g/dL (ref 6.5–8.1)

## 2020-07-18 LAB — LIPASE, BLOOD: Lipase: 29 U/L (ref 11–51)

## 2020-07-18 LAB — TROPONIN I (HIGH SENSITIVITY): Troponin I (High Sensitivity): 2 ng/L (ref <18)

## 2020-07-18 MED ORDER — SODIUM CHLORIDE 0.9 % IV BOLUS
1000.0000 mL | Freq: Once | INTRAVENOUS | Status: AC
Start: 2020-07-18 — End: 2020-07-18
  Administered 2020-07-18: 1000 mL via INTRAVENOUS

## 2020-07-18 MED ORDER — ALUM & MAG HYDROXIDE-SIMETH 200-200-20 MG/5ML PO SUSP
30.0000 mL | Freq: Once | ORAL | Status: AC
  Administered 2020-07-18: 30 mL via ORAL
  Filled 2020-07-18: qty 30

## 2020-07-18 MED ORDER — FAMOTIDINE 20 MG PO TABS: 20.0000 mg | ORAL_TABLET | Freq: Two times a day (BID) | ORAL | 0 refills | Status: DC

## 2020-07-18 MED ORDER — LIDOCAINE VISCOUS HCL 2 % MT SOLN
15.0000 mL | Freq: Once | OROMUCOSAL | Status: AC
  Administered 2020-07-18: 15 mL via ORAL
  Filled 2020-07-18: qty 15

## 2020-07-18 NOTE — ED Provider Notes (Signed)
[facility] EMERGENCY DEPARTMENT Provider Note   CSN: [id number] Arrival date & time: 07/18/20  1522     History Chief Complaint  Patient presents with   Chest Pain    Darren Oconnor is a 30 y.o. male.  The history is provided by the patient and medical records.  Chest Pain Darren Oconnor is a 30 y.o. male who presents to the Emergency Department complaining of chest pain. He presents the emergency department complaining of chest pain that started yesterday. Pain is located in the left upper chest is described as a pressure sensation that rates the left arm. His constant nature. It is worse with swallowing and movement. Two days ago he had vomiting and diarrhea and described as food poisoning. He denies any fevers. He is having diaphoresis at times. No cough or difficulty breathing. He was seen in urgent care yesterday and tested negative for COVID. He presents today for ongoing symptoms. He does have a history of hypertension. No tobacco, alcohol, drug use.    Past Medical History:  Diagnosis Date   Asthma    Headache(784.0)    Hypertension     Patient Active Problem List   Diagnosis Date Noted   Headache 10/18/2013   HTN (hypertension) 10/18/2013   Chest pain 10/18/2013    Past Surgical History:  Procedure Laterality Date   BACK SURGERY     TESTICLE SURGERY         Family History  Problem Relation Age of Onset   Heart attack Other     Social History   Tobacco Use   Smoking status: Never   Smokeless tobacco: Never  Substance Use Topics   Alcohol use: No   Drug use: No    Home Medications Prior to Admission medications   Medication Sig Start Date End Date Taking? Authorizing Provider  famotidine (PEPCID) 20 MG tablet Take 1 tablet (20 mg total) by mouth 2 (two) times daily. 07/18/20   Tilden Fossa, MD    Allergies    Fish-derived products and Shellfish allergy  Review of Systems   Review of Systems  Cardiovascular:  Positive for chest  pain.  All other systems reviewed and are negative.  Physical Exam Updated Vital Signs BP (!) 150/95   Pulse 88   Temp 98.7 F (37.1 C) (Oral)   Resp 15   Ht 6\' 4"  (1.93 m)   Wt (!) 168.7 kg   SpO2 100%   BMI 45.28 kg/m   Physical Exam Vitals and nursing note reviewed.  Constitutional:      Appearance: He is well-developed.  HENT:     Head: Normocephalic and atraumatic.  Cardiovascular:     Rate and Rhythm: Normal rate and regular rhythm.     Heart sounds: No murmur heard. Pulmonary:     Effort: Pulmonary effort is normal. No respiratory distress.     Breath sounds: Normal breath sounds.  Chest:     Chest wall: Tenderness present.  Abdominal:     Palpations: Abdomen is soft.     Tenderness: There is no abdominal tenderness. There is no guarding or rebound.  Musculoskeletal:        General: No tenderness.     Comments: 2+ DP pulses bilaterally  Skin:    General: Skin is warm and dry.  Neurological:     Mental Status: He is alert and oriented to person, place, and time.  Psychiatric:        Behavior: Behavior normal.  ED Results / Procedures / Treatments   Labs (all labs ordered are listed, but only abnormal results are displayed) Labs Reviewed  COMPREHENSIVE METABOLIC PANEL - Abnormal; Notable for the following components:      Result Value   Glucose, Bld 139 (*)    Creatinine, Ser 1.27 (*)    Calcium 8.8 (*)    All other components within normal limits  CBC WITH DIFFERENTIAL/PLATELET  LIPASE, BLOOD  TROPONIN I (HIGH SENSITIVITY)  TROPONIN I (HIGH SENSITIVITY)    EKG EKG Interpretation  Date/Time:  Wednesday July 18 2020 15:31:50 EDT Ventricular Rate:  96 PR Interval:  149 QRS Duration: 101 QT Interval:  332 QTC Calculation: 420 R Axis:   -18 Text Interpretation: Sinus rhythm Probable left ventricular hypertrophy Confirmed by Tilden Fossa [phone]) on 07/18/2020 3:36:47 PM  Radiology DG Chest 2 View  Result Date: 07/18/2020 CLINICAL DATA:   Chest pain EXAM: CHEST - 2 VIEW COMPARISON:  September 21, 2019 FINDINGS: Lungs are clear. Heart size and pulmonary vascularity are normal. No adenopathy. No pneumothorax. No bone lesions. IMPRESSION: Lungs clear.  Cardiac silhouette normal. Electronically Signed   By: Bretta Bang III M.D.   On: 07/18/2020 16:16    Procedures Procedures   Medications Ordered in ED Medications  sodium chloride 0.9 % bolus 1,000 mL (1,000 mLs Intravenous New Bag/Given 07/18/20 1615)  alum & mag hydroxide-simeth (MAALOX/MYLANTA) 200-200-20 MG/5ML suspension 30 mL (30 mLs Oral Given 07/18/20 1604)    And  lidocaine (XYLOCAINE) 2 % viscous mouth solution 15 mL (15 mLs Oral Given 07/18/20 1604)    ED Course  I have reviewed the triage vital signs and the nursing notes.  Pertinent labs & imaging results that were available during my care of the patient were reviewed by me and considered in my medical decision making (see chart for details).    MDM Rules/Calculators/A&P                         patient here for evaluation of chest pain. He did have vomiting two days ago. Symptoms improved after G.I. cocktail. EKG is without acute ischemic changes. He is a 24 hours of pain in troponin is negative. Presentation is not consistent with ACS, dissection, PE, boerhaave esophagus. Patient does report having reflux, was recently started on PPI but he has been taking it after his meals. Discussed with patient how to take the PPI. Will start needs to blocker as well. Discussed outpatient follow-up and return precautions. Final Clinical Impression(s) / ED Diagnoses Final diagnoses:  Gastroesophageal reflux disease without esophagitis  Atypical chest pain    Rx / DC Orders ED Discharge Orders          Ordered    famotidine (PEPCID) 20 MG tablet  2 times daily,   Status:  Discontinued        07/18/20 1711    famotidine (PEPCID) 20 MG tablet  2 times daily        07/18/20 1712             Tilden Fossa,  MD 07/18/20 1726

## 2020-07-18 NOTE — ED Triage Notes (Addendum)
Pt c/o CP x 2 days-denies fever/fle sx-states he was seen at [facility] yesterday with neg covid test and was advised to come to ED yesterday-NAD-steady gait

## 2021-03-16 ENCOUNTER — Inpatient Hospital Stay (HOSPITAL_BASED_OUTPATIENT_CLINIC_OR_DEPARTMENT_OTHER)
Admission: EM | Admit: 2021-03-16 | Discharge: 2021-03-18 | DRG: 194 | Disposition: A | Discharge status: Home / Self Care | Payer: Medicare HMO | Attending: Internal Medicine | Admitting: Internal Medicine

## 2021-03-16 ENCOUNTER — Other Ambulatory Visit: Payer: Self-pay

## 2021-03-16 ENCOUNTER — Emergency Department (HOSPITAL_BASED_OUTPATIENT_CLINIC_OR_DEPARTMENT_OTHER): Payer: Medicare HMO

## 2021-03-16 ENCOUNTER — Encounter (HOSPITAL_BASED_OUTPATIENT_CLINIC_OR_DEPARTMENT_OTHER): Payer: Self-pay | Admitting: Emergency Medicine

## 2021-03-16 DIAGNOSIS — E66813 Obesity, class 3: Secondary | ICD-10-CM

## 2021-03-16 DIAGNOSIS — R079 Chest pain, unspecified: Secondary | ICD-10-CM | POA: Diagnosis not present

## 2021-03-16 DIAGNOSIS — R0789 Other chest pain: Secondary | ICD-10-CM | POA: Diagnosis not present

## 2021-03-16 DIAGNOSIS — I10 Essential (primary) hypertension: Secondary | ICD-10-CM | POA: Diagnosis not present

## 2021-03-16 DIAGNOSIS — Z6841 Body Mass Index (BMI) 40.0 and over, adult: Secondary | ICD-10-CM

## 2021-03-16 DIAGNOSIS — K219 Gastro-esophageal reflux disease without esophagitis: Secondary | ICD-10-CM | POA: Diagnosis present

## 2021-03-16 DIAGNOSIS — J189 Pneumonia, unspecified organism: Secondary | ICD-10-CM | POA: Diagnosis not present

## 2021-03-16 DIAGNOSIS — Z91013 Allergy to seafood: Secondary | ICD-10-CM

## 2021-03-16 DIAGNOSIS — Z20822 Contact with and (suspected) exposure to covid-19: Secondary | ICD-10-CM | POA: Diagnosis present

## 2021-03-16 DIAGNOSIS — Z8249 Family history of ischemic heart disease and other diseases of the circulatory system: Secondary | ICD-10-CM

## 2021-03-16 LAB — CBC WITH DIFFERENTIAL/PLATELET
Abs Immature Granulocytes: 0.02 10*3/uL (ref 0.00–0.07)
Basophils Absolute: 0 10*3/uL (ref 0.0–0.1)
Basophils Relative: 0 %
Eosinophils Absolute: 0.1 10*3/uL (ref 0.0–0.5)
Eosinophils Relative: 1 %
HCT: 45.2 % (ref 39.0–52.0)
Hemoglobin: 14.9 g/dL (ref 13.0–17.0)
Immature Granulocytes: 0 %
Lymphocytes Relative: 16 %
Lymphs Abs: 1.3 10*3/uL (ref 0.7–4.0)
MCH: 27.7 pg (ref 26.0–34.0)
MCHC: 33 g/dL (ref 30.0–36.0)
MCV: 84 fL (ref 80.0–100.0)
Monocytes Absolute: 0.8 10*3/uL (ref 0.1–1.0)
Monocytes Relative: 10 %
Neutro Abs: 5.7 10*3/uL (ref 1.7–7.7)
Neutrophils Relative %: 73 %
Platelets: 272 10*3/uL (ref 150–400)
RBC: 5.38 MIL/uL (ref 4.22–5.81)
RDW: 13.3 % (ref 11.5–15.5)
WBC: 7.8 10*3/uL (ref 4.0–10.5)
nRBC: 0 % (ref 0.0–0.2)

## 2021-03-16 LAB — I-STAT VENOUS BLOOD GAS, ED
Acid-Base Excess: 7 mmol/L — ABNORMAL HIGH (ref 0.0–2.0)
Bicarbonate: 34.6 mmol/L — ABNORMAL HIGH (ref 20.0–28.0)
Calcium, Ion: 1.14 mmol/L — ABNORMAL LOW (ref 1.15–1.40)
HCT: 47 % (ref 39.0–52.0)
Hemoglobin: 16 g/dL (ref 13.0–17.0)
O2 Saturation: 48 %
Patient temperature: 98.2
Potassium: 4.4 mmol/L (ref 3.5–5.1)
Sodium: 138 mmol/L (ref 135–145)
TCO2: 36 mmol/L — ABNORMAL HIGH (ref 22–32)
pCO2, Ven: 57.1 mmHg (ref 44–60)
pH, Ven: 7.389 (ref 7.25–7.43)
pO2, Ven: 27 mmHg — CL (ref 32–45)

## 2021-03-16 LAB — RAPID URINE DRUG SCREEN, HOSP PERFORMED
Amphetamines: NOT DETECTED
Barbiturates: NOT DETECTED
Benzodiazepines: NOT DETECTED
Cocaine: NOT DETECTED
Opiates: POSITIVE — AB
Tetrahydrocannabinol: NOT DETECTED

## 2021-03-16 LAB — HIV ANTIBODY (ROUTINE TESTING W REFLEX): HIV Screen 4th Generation wRfx: NONREACTIVE

## 2021-03-16 LAB — COMPREHENSIVE METABOLIC PANEL
ALT: 23 U/L (ref 0–44)
AST: 24 U/L (ref 15–41)
Albumin: 3.9 g/dL (ref 3.5–5.0)
Alkaline Phosphatase: 62 U/L (ref 38–126)
Anion gap: 9 (ref 5–15)
BUN: 9 mg/dL (ref 6–20)
CO2: 29 mmol/L (ref 22–32)
Calcium: 9.1 mg/dL (ref 8.9–10.3)
Chloride: 96 mmol/L — ABNORMAL LOW (ref 98–111)
Creatinine, Ser: 1.11 mg/dL (ref 0.61–1.24)
GFR, Estimated: >60 mL/min (ref >60)
Glucose, Bld: 142 mg/dL — ABNORMAL HIGH (ref 70–99)
Potassium: 3.8 mmol/L (ref 3.5–5.1)
Sodium: 134 mmol/L — ABNORMAL LOW (ref 135–145)
Total Bilirubin: 0.6 mg/dL (ref 0.3–1.2)
Total Protein: 8.1 g/dL (ref 6.5–8.1)

## 2021-03-16 LAB — LACTIC ACID, PLASMA: Lactic Acid, Venous: 1 mmol/L (ref 0.5–1.9)

## 2021-03-16 LAB — RESP PANEL BY RT-PCR (FLU A&B, COVID) ARPGX2
Influenza A by PCR: NEGATIVE
Influenza B by PCR: NEGATIVE
SARS Coronavirus 2 by RT PCR: NEGATIVE

## 2021-03-16 LAB — TROPONIN I (HIGH SENSITIVITY)
Troponin I (High Sensitivity): 2 ng/L (ref <18)
Troponin I (High Sensitivity): 2 ng/L (ref <18)

## 2021-03-16 LAB — LIPASE, BLOOD: Lipase: 30 U/L (ref 11–51)

## 2021-03-16 LAB — PROCALCITONIN: Procalcitonin: <0.1 ng/mL

## 2021-03-16 MED ORDER — FENTANYL CITRATE PF 50 MCG/ML IJ SOSY
PREFILLED_SYRINGE | INTRAMUSCULAR | Status: AC
  Filled 2021-03-16: qty 1

## 2021-03-16 MED ORDER — MORPHINE SULFATE (PF) 4 MG/ML IV SOLN
6.0000 mg | Freq: Once | INTRAVENOUS | Status: AC
  Administered 2021-03-16: 6 mg via INTRAVENOUS
  Filled 2021-03-16: qty 2

## 2021-03-16 MED ORDER — MORPHINE SULFATE (PF) 4 MG/ML IV SOLN: 4.0000 mg | Freq: Once | INTRAVENOUS | Status: DC

## 2021-03-16 MED ORDER — SODIUM CHLORIDE 0.9 % IV SOLN
1.0000 g | Freq: Once | INTRAVENOUS | Status: AC
  Administered 2021-03-16: 1 g via INTRAVENOUS
  Filled 2021-03-16: qty 10

## 2021-03-16 MED ORDER — LIDOCAINE VISCOUS HCL 2 % MT SOLN
15.0000 mL | Freq: Once | OROMUCOSAL | Status: AC
  Administered 2021-03-16: 15 mL via ORAL
  Filled 2021-03-16: qty 15

## 2021-03-16 MED ORDER — LACTATED RINGERS IV BOLUS: 1000.0000 mL | Freq: Once | INTRAVENOUS | Status: DC

## 2021-03-16 MED ORDER — SODIUM CHLORIDE 0.9 % IV SOLN
1.0000 g | INTRAVENOUS | Status: DC
  Administered 2021-03-17: 1 g via INTRAVENOUS
  Filled 2021-03-16 (×2): qty 10

## 2021-03-16 MED ORDER — ACETAMINOPHEN 650 MG RE SUPP: 650.0000 mg | Freq: Four times a day (QID) | RECTAL | Status: DC | PRN

## 2021-03-16 MED ORDER — ALUM & MAG HYDROXIDE-SIMETH 200-200-20 MG/5ML PO SUSP
30.0000 mL | Freq: Once | ORAL | Status: AC
  Administered 2021-03-16: 30 mL via ORAL
  Filled 2021-03-16: qty 30

## 2021-03-16 MED ORDER — SODIUM CHLORIDE 0.9% FLUSH
3.0000 mL | Freq: Two times a day (BID) | INTRAVENOUS | Status: DC
  Administered 2021-03-17 – 2021-03-18 (×2): 3 mL via INTRAVENOUS

## 2021-03-16 MED ORDER — NITROGLYCERIN 0.4 MG SL SUBL: 0.4000 mg | SUBLINGUAL_TABLET | SUBLINGUAL | Status: DC | PRN

## 2021-03-16 MED ORDER — SODIUM CHLORIDE 0.9 % IV SOLN
500.0000 mg | Freq: Once | INTRAVENOUS | Status: AC
  Administered 2021-03-16: 500 mg via INTRAVENOUS
  Filled 2021-03-16: qty 5

## 2021-03-16 MED ORDER — ONDANSETRON HCL 4 MG/2ML IJ SOLN
4.0000 mg | Freq: Once | INTRAMUSCULAR | Status: AC
  Administered 2021-03-16: 4 mg via INTRAVENOUS
  Filled 2021-03-16: qty 2

## 2021-03-16 MED ORDER — AZITHROMYCIN 500 MG IV SOLR
500.0000 mg | INTRAVENOUS | Status: DC
  Administered 2021-03-17: 500 mg via INTRAVENOUS
  Filled 2021-03-16 (×2): qty 5

## 2021-03-16 MED ORDER — POLYETHYLENE GLYCOL 3350 17 G PO PACK: 17.0000 g | PACK | Freq: Every day | ORAL | Status: DC | PRN

## 2021-03-16 MED ORDER — ENOXAPARIN SODIUM 300 MG/3ML IJ SOLN
1.0000 mg/kg | Freq: Two times a day (BID) | INTRAMUSCULAR | Status: DC
  Filled 2021-03-16 (×3): qty 1.7

## 2021-03-16 MED ORDER — SODIUM CHLORIDE 0.9 % IV BOLUS
1000.0000 mL | Freq: Once | INTRAVENOUS | Status: AC
  Administered 2021-03-16: 1000 mL via INTRAVENOUS

## 2021-03-16 MED ORDER — NITROGLYCERIN 0.4 MG SL SUBL
SUBLINGUAL_TABLET | SUBLINGUAL | Status: AC
  Administered 2021-03-16: 0.4 mg
  Filled 2021-03-16: qty 3

## 2021-03-16 MED ORDER — FENTANYL CITRATE PF 50 MCG/ML IJ SOSY
50.0000 ug | PREFILLED_SYRINGE | Freq: Once | INTRAMUSCULAR | Status: AC
  Administered 2021-03-16: 50 ug via INTRAVENOUS
  Filled 2021-03-16: qty 1

## 2021-03-16 MED ORDER — ACETAMINOPHEN 325 MG PO TABS
650.0000 mg | ORAL_TABLET | Freq: Four times a day (QID) | ORAL | Status: DC | PRN
  Administered 2021-03-17: 650 mg via ORAL
  Filled 2021-03-16: qty 2

## 2021-03-16 MED ORDER — IOHEXOL 350 MG/ML SOLN
100.0000 mL | Freq: Once | INTRAVENOUS | Status: AC | PRN
  Administered 2021-03-16: 100 mL via INTRAVENOUS

## 2021-03-16 NOTE — Plan of Care (Signed)
°  Problem: Education: Goal: Knowledge of General Education information will improve Description: Including pain rating scale, medication(s)/side effects and non-pharmacologic comfort measures 03/16/2021 1823 by Roselyn Bering, RN Outcome: Progressing 03/16/2021 1823 by Roselyn Bering, RN Outcome: Progressing

## 2021-03-16 NOTE — ED Notes (Signed)
Pt given water to drink per EDP verbal approval.

## 2021-03-16 NOTE — ED Notes (Signed)
Patient transported to CT on cardiac monitor, with RN

## 2021-03-16 NOTE — ED Provider Notes (Signed)
[facility] EMERGENCY DEPARTMENT Provider Note  CSN: [id number] Arrival date & time: 03/16/21 1219  Chief Complaint(s) Chest Pain  HPI Darren Oconnor is a 31 y.o. male with PMH asthma, HTN who presents emergency department for evaluation of chest pain.  Patient states that his pain has been present for 3 days but worsened significantly today bring him to the emergency department.  Patient arrives diaphoretic clutching his chest with complains of chest pain radiating to his back.  Patient with associated shortness of breath.  Denies abdominal pain, vomiting, fever or other systemic symptoms.   Chest Pain Associated symptoms: diaphoresis and shortness of breath    Past Medical History Past Medical History:  Diagnosis Date   Asthma    Headache(784.0)    Hypertension    Patient Active Problem List   Diagnosis Date Noted   Headache 10/18/2013   HTN (hypertension) 10/18/2013   Chest pain 10/18/2013   Home Medication(s) Prior to Admission medications   Medication Sig Start Date End Date Taking? Authorizing Provider  famotidine (PEPCID) 20 MG tablet Take 1 tablet (20 mg total) by mouth 2 (two) times daily. 07/18/20   Quintella Reichert, MD                                                                                                                                    Past Surgical History Past Surgical History:  Procedure Laterality Date   BACK SURGERY     TESTICLE SURGERY     Family History Family History  Problem Relation Age of Onset   Heart attack Other     Social History Social History   Tobacco Use   Smoking status: Never   Smokeless tobacco: Never  Substance Use Topics   Alcohol use: No   Drug use: No   Allergies Fish-derived products and Shellfish allergy  Review of Systems Review of Systems  Constitutional:  Positive for diaphoresis.  Respiratory:  Positive for shortness of breath.   Cardiovascular:  Positive for chest pain.   Physical  Exam Vital Signs  I have reviewed the triage vital signs BP 125/70    Pulse 100    Temp 98.9 F (37.2 C) (Oral)    Resp 12    SpO2 97%   Physical Exam Vitals and nursing note reviewed.  Constitutional:      General: He is in acute distress.     Appearance: He is well-developed. He is ill-appearing and diaphoretic.  HENT:     Head: Normocephalic and atraumatic.  Eyes:     Conjunctiva/sclera: Conjunctivae normal.  Cardiovascular:     Rate and Rhythm: Normal rate and regular rhythm.     Heart sounds: No murmur heard. Pulmonary:     Effort: Pulmonary effort is normal. No respiratory distress.     Breath sounds: Normal breath sounds.  Abdominal:     Palpations: Abdomen is soft.     Tenderness:  There is no abdominal tenderness.  Musculoskeletal:        General: No swelling.     Cervical back: Neck supple.  Skin:    General: Skin is warm.     Capillary Refill: Capillary refill takes less than 2 seconds.  Neurological:     Mental Status: He is alert.  Psychiatric:        Mood and Affect: Mood normal.    ED Results and Treatments Labs (all labs ordered are listed, but only abnormal results are displayed) Labs Reviewed  COMPREHENSIVE METABOLIC PANEL - Abnormal; Notable for the following components:      Result Value   Sodium 134 (*)    Chloride 96 (*)    Glucose, Bld 142 (*)    All other components within normal limits  I-STAT VENOUS BLOOD GAS, ED - Abnormal; Notable for the following components:   pO2, Ven 27 (*)    Bicarbonate 34.6 (*)    TCO2 36 (*)    Acid-Base Excess 7.0 (*)    Calcium, Ion 1.14 (*)    All other components within normal limits  RESP PANEL BY RT-PCR (FLU A&B, COVID) ARPGX2  CBC WITH DIFFERENTIAL/PLATELET  LIPASE, BLOOD  LACTIC ACID, PLASMA  LACTIC ACID, PLASMA  TROPONIN I (HIGH SENSITIVITY)                                                                                                                          Radiology No results  found.  Pertinent labs & imaging results that were available during my care of the patient were reviewed by me and considered in my medical decision making (see MDM for details).  Medications Ordered in ED Medications  nitroGLYCERIN (NITROSTAT) SL tablet 0.4 mg (0.4 mg Sublingual Given 03/16/21 1231)  lactated ringers bolus 1,000 mL (has no administration in time range)  fentaNYL (SUBLIMAZE) 50 MCG/ML injection (has no administration in time range)  fentaNYL (SUBLIMAZE) injection 50 mcg (50 mcg Intravenous Given 03/16/21 1249)  iohexol (OMNIPAQUE) 350 MG/ML injection 100 mL (100 mLs Intravenous Contrast Given 03/16/21 1251)  alum & mag hydroxide-simeth (MAALOX/MYLANTA) 200-200-20 MG/5ML suspension 30 mL (30 mLs Oral Given 03/16/21 1314)    And  lidocaine (XYLOCAINE) 2 % viscous mouth solution 15 mL (15 mLs Oral Given 03/16/21 1314)  ondansetron (ZOFRAN) injection 4 mg (4 mg Intravenous Given 03/16/21 1313)  Procedures .Critical Care Performed by: Teressa Lower, MD Authorized by: Teressa Lower, MD   Critical care provider statement:    Critical care time (minutes):  75   Critical care was time spent personally by me on the following activities:  Development of treatment plan with patient or surrogate, discussions with consultants, evaluation of patient's response to treatment, examination of patient, ordering and review of laboratory studies, ordering and review of radiographic studies, ordering and performing treatments and interventions, pulse oximetry, re-evaluation of patient's condition and review of old charts Ultrasound ED Echo  Date/Time: 03/16/2021 2:20 PM Performed by: Teressa Lower, MD Authorized by: Teressa Lower, MD   Procedure details:    Indications: chest pain     Views: subxiphoid, parasternal long axis view and parasternal short axis  view     Images: not archived     Limitations:  Body habitus and increased thoracic air Findings:    Pericardium: no pericardial effusion   Impression:    Impression: normal    (including critical care time)  Medical Decision Making / ED Course   This patient presents to the ED for concern of chest pain, this involves an extensive number of treatment options, and is a complaint that carries with it a high risk of complications and morbidity.  The differential diagnosis includes aortic dissection, MI, PE, pneumonia, aspiration, reflux, costochondritis  MDM: Patient seen emergency department for evaluation of chest pain shortness of breath.  Physical exam reveals a toxic appearing diaphoretic patient clutching his chest, pulmonary exam unremarkable.  Initial ECG with no evidence of ischemia.  Nitroglycerin administered with no improvement.  Bedside ultrasound with no pericardial effusion.  High initial concern for aortic dissection given patient presentation and thus he was taken for an emergent CT aortogram which was reassuringly negative for dissection but does show multifocal pneumonia worse in the right upper lobe.  Laboratory evaluation with mild hyponatremia to 134, hypochloremia to 96 but is otherwise unremarkable.  High-sensitivity troponin negative.  Patient started on azithromycin and ceftriaxone and on reevaluation, patient pain improved after interventions, but remains persistent.  He is maintaining his oxygen saturations without supplemental oxygen but given persistent chest pain and traumatic initial presentation, patient will require inpatient observation for treatment of his pneumonia and persistent chest pain.  Patient would benefit from echocardiography.  Of note, I am unable to rule out pulmonary embolism at this time due to initial contrast bolus for CT aortogram.  Patient may benefit from CT PE tomorrow morning.   Additional history obtained:  -External records from outside  source obtained and reviewed including: Chart review including previous notes, labs, imaging, consultation notes   Lab Tests: -I ordered, reviewed, and interpreted labs.   The pertinent results include:   Labs Reviewed  COMPREHENSIVE METABOLIC PANEL - Abnormal; Notable for the following components:      Result Value   Sodium 134 (*)    Chloride 96 (*)    Glucose, Bld 142 (*)    All other components within normal limits  I-STAT VENOUS BLOOD GAS, ED - Abnormal; Notable for the following components:   pO2, Ven 27 (*)    Bicarbonate 34.6 (*)    TCO2 36 (*)    Acid-Base Excess 7.0 (*)    Calcium, Ion 1.14 (*)    All other components within normal limits  RESP PANEL BY RT-PCR (FLU A&B, COVID) ARPGX2  CBC WITH DIFFERENTIAL/PLATELET  LIPASE, BLOOD  LACTIC ACID, PLASMA  LACTIC ACID, PLASMA  TROPONIN I (HIGH  SENSITIVITY)      EKG   EKG Interpretation  Date/Time:  Saturday March 16 2021 12:33:38 EST Ventricular Rate:  122 PR Interval:  143 QRS Duration: 98 QT Interval:  293 QTC Calculation: 420 R Axis:   -3 Text Interpretation: Sinus tachycardia Confirmed by Toia Micale ([id number]) on 03/16/2021 12:40:41 PM         Imaging Studies ordered: I ordered imaging studies including CT Aortogram  I independently visualized and interpreted imaging. I agree with the radiologist interpretation   Medicines ordered and prescription drug management: Meds ordered this encounter  Medications   nitroGLYCERIN (NITROSTAT) 0.4 MG SL tablet    Pegram, Jerri F: cabinet override   fentaNYL (SUBLIMAZE) injection 50 mcg   nitroGLYCERIN (NITROSTAT) SL tablet 0.4 mg   DISCONTD: nitroGLYCERIN (NITROSTAT) SL tablet 0.4 mg   lactated ringers bolus 1,000 mL   iohexol (OMNIPAQUE) 350 MG/ML injection 100 mL   fentaNYL (SUBLIMAZE) 50 MCG/ML injection    Keslar, Rachel A: cabinet override   AND Linked Order Group    alum & mag hydroxide-simeth (MAALOX/MYLANTA) 200-200-20 MG/5ML suspension 30 mL     lidocaine (XYLOCAINE) 2 % viscous mouth solution 15 mL   ondansetron (ZOFRAN) injection 4 mg    -I have reviewed the patients home medicines and have made adjustments as needed  Critical interventions CT aortogram, frequent reassessments, bedside ultrasound  Consultations Obtained: I requested consultation with the cardiologist,  and discussed lab and imaging findings as well as pertinent plan - they recommend: Medical admission   Cardiac Monitoring: The patient was maintained on a cardiac monitor.  I personally viewed and interpreted the cardiac monitored which showed an underlying rhythm of: Sinus tachycardia  Social Determinants of Health:  Factors impacting patients care include: None   Reevaluation: After the interventions noted above, I reevaluated the patient and found that they have :improved  Co morbidities that complicate the patient evaluation  Past Medical History:  Diagnosis Date   Asthma    Headache(784.0)    Hypertension       Dispostion: I considered admission for this patient, and due to dramatic diaphoretic presentation in the setting of persistent chest pain and multifocal pneumonia patient will be admitted.     Final Clinical Impression(s) / ED Diagnoses Final diagnoses:  None     @PCDICTATION @    Teressa Lower, MD 03/16/21 [phone]

## 2021-03-16 NOTE — ED Provider Notes (Signed)
Patient presenting with chest pain radiating to the back and acute diaphoresis.  High concern for aortic dissection.  Patient will require contrasted aortogram prior to return of laboratory values.  This is of medical necessity.   Teressa Lower, MD 03/16/21 [phone]

## 2021-03-16 NOTE — ED Notes (Signed)
ED Provider at bedside. 

## 2021-03-16 NOTE — Plan of Care (Signed)
  Problem: Education: Goal: Knowledge of General Education information will improve Description Including pain rating scale, medication(s)/side effects and non-pharmacologic comfort measures Outcome: Progressing   

## 2021-03-16 NOTE — H&P (Addendum)
History and Physical   Darren Oconnor [id number] DOB: Dec 29, 1990 DOA: 03/16/2021  PCP: [place] Siren., MD   Patient coming from: Home  Chief Complaint: Chest pain  HPI: Darren Oconnor is a 31 y.o. male with medical history significant of hypertension, asthma, bipolar, depression, migraine, lumbar disc disease, mild intellectual disability presenting with chest pain.  Patient has had 3 days of ongoing chest pain with radiation to his back.  Was significantly worse today which caused him to seek medical attention.  He has had some associated shortness of breath as well.  Also reports associated headache that occurs due to how severe the pain is per his report.  He does report that the pain has been somewhat reproducible with pressure on the chest however not not as severe as it was without pressure.  Denies trauma to the chest.  He denies fevers, chills, abdominal pain, constipation, diarrhea, nausea, vomiting.  ED Course: Vital signs in the ED significant for heart rate in the 90s, blood pressure in the [id number] to [id number] systolic, saturating well on room air.  Lab work-up included CMP with sodium 134, chloride 96, glucose 142.  CBC within normal limits.  Troponin negative x2.  Lactic acid normal.  Lipase normal.  Respiratory panel for flu and COVID-negative.  UDS positive for opiates.  CTA dissection study of the chest abdomen pelvis showed no evidence of dissection or aneurysm, changes in the lung consistent with multifocal pneumonia, mild mediastinal lymphadenopathy likely reactive, mild inguinal lymphadenopathy possibly reactive.  Patient received ceftriaxone azithromycin in the ED as well as morphine, fentanyl, nitro, GI cocktail, 1 L of IV fluids.  Point-of-care ultrasound was performed in the ED per notes and noted this was negative for pericardial effusion.  EDP spoke with cardiology who recommended getting formal echo and to reconsult if abnormal.  Pulmonary embolism remained on differential  and there was plan to get this evaluated with CTA PE study however due to patient's contrast burden from previous CTA this was unable to be performed and is scheduled for tomorrow.  Therapeutic Lovenox has been ordered while we wait this to be ruled out.  Review of Systems: As per HPI otherwise all other systems reviewed and are negative.  Past Medical History:  Diagnosis Date   Asthma    Headache(784.0)    Hypertension     Past Surgical History:  Procedure Laterality Date   BACK SURGERY     TESTICLE SURGERY      Social History  reports that he has never smoked. He has never used smokeless tobacco. He reports that he does not drink alcohol and does not use drugs.  Allergies  Allergen Reactions   Fish-Derived Products    Shellfish Allergy     Family History  Problem Relation Age of Onset   Heart attack Other   Reviewed on admission  Prior to Admission medications   Medication Sig Start Date End Date Taking? Authorizing Provider  famotidine (PEPCID) 20 MG tablet Take 1 tablet (20 mg total) by mouth 2 (two) times daily. 07/18/20   Quintella Reichert, MD    Physical Exam: Vitals:   03/16/21 1514 03/16/21 1515 03/16/21 1552 03/16/21 1750  BP: 126/86 139/76  (!) 140/93  Pulse: 95 90  85  Resp: (!) 24 15  17   Temp:    97.9 F (36.6 C)  TempSrc:    Oral  SpO2: 97% 96%  97%  Weight:   (!) 171.6 kg (!) 167.6 kg  Height:  6\' 5"  (1.956 m)    Physical Exam Constitutional:      General: He is not in acute distress.    Appearance: Normal appearance. He is obese.  HENT:     Head: Normocephalic and atraumatic.     Mouth/Throat:     Mouth: Mucous membranes are moist.     Pharynx: Oropharynx is clear.  Eyes:     Extraocular Movements: Extraocular movements intact.     Pupils: Pupils are equal, round, and reactive to light.  Cardiovascular:     Rate and Rhythm: Normal rate and regular rhythm.     Pulses: Normal pulses.     Heart sounds: Normal heart sounds.  Pulmonary:      Effort: Pulmonary effort is normal. No respiratory distress.     Breath sounds: Normal breath sounds.  Abdominal:     General: Bowel sounds are normal. There is no distension.     Palpations: Abdomen is soft.     Tenderness: There is no abdominal tenderness.  Musculoskeletal:        General: No swelling or deformity.  Skin:    General: Skin is warm and dry.  Neurological:     General: No focal deficit present.     Mental Status: Mental status is at baseline.   Labs on Admission: I have personally reviewed following labs and imaging studies  CBC: Recent Labs  Lab 03/16/21 1240 03/16/21 1247  WBC 7.8  --   NEUTROABS 5.7  --   HGB 14.9 16.0  HCT 45.2 47.0  MCV 84.0  --   PLT 272  --     Basic Metabolic Panel: Recent Labs  Lab 03/16/21 1240 03/16/21 1247  NA 134* 138  K 3.8 4.4  CL 96*  --   CO2 29  --   GLUCOSE 142*  --   BUN 9  --   CREATININE 1.11  --   CALCIUM 9.1  --     GFR: Estimated Creatinine Clearance: 164.3 mL/min (by C-G formula based on SCr of 1.11 mg/dL).  Liver Function Tests: Recent Labs  Lab 03/16/21 1240  AST 24  ALT 23  ALKPHOS 62  BILITOT 0.6  PROT 8.1  ALBUMIN 3.9    Urine analysis:    Component Value Date/Time   COLORURINE YELLOW 08/28/2015 0520   APPEARANCEUR CLEAR 08/28/2015 0520   LABSPEC 1.021 08/28/2015 0520   PHURINE 6.5 08/28/2015 0520   GLUCOSEU NEGATIVE 08/28/2015 0520   HGBUR NEGATIVE 08/28/2015 0520   BILIRUBINUR NEGATIVE 08/28/2015 0520   KETONESUR NEGATIVE 08/28/2015 0520   PROTEINUR NEGATIVE 08/28/2015 0520   UROBILINOGEN 0.2 06/17/2014 0410   NITRITE NEGATIVE 08/28/2015 0520   LEUKOCYTESUR NEGATIVE 08/28/2015 0520    Radiological Exams on Admission: CT Angio Chest/Abd/Pel for Dissection W and/or Wo Contrast  Result Date: 03/16/2021 CLINICAL DATA:  Chest pain, shortness of breath, diaphoresis. Clinical concern for aortic dissection EXAM: CT ANGIOGRAPHY CHEST, ABDOMEN AND PELVIS TECHNIQUE: Non-contrast CT  of the chest was initially obtained. Multidetector CT imaging through the chest, abdomen and pelvis was performed using the standard protocol during bolus administration of intravenous contrast. Multiplanar reconstructed images and MIPs were obtained and reviewed to evaluate the vascular anatomy. RADIATION DOSE REDUCTION: This exam was performed according to the departmental dose-optimization program which includes automated exposure control, adjustment of the mA and/or kV according to patient size and/or use of iterative reconstruction technique. CONTRAST:  193mL OMNIPAQUE IOHEXOL 350 MG/ML SOLN COMPARISON:  CT abdomen pelvis 03/11/2019 FINDINGS: CTA  CHEST FINDINGS Cardiovascular: Noncontrast CT through the chest demonstrates no evidence of aortic hematoma. Preferential opacification of the thoracic aorta. No evidence of aortic dissection or aneurysm. Three vessel arch. Central pulmonary vasculature is within normal limits. No central filling defects. Heart size is normal. No pericardial effusion. Mediastinum/Nodes: Multiple mildly prominent mediastinal and right hilar lymph nodes. Reference nodes include 10 mm pretracheal node (series 6, image 34) and 14 mm right hilar node (series 6, image 40). Borderline enlarged left hilar lymph node measuring 9 mm (series 6, image 49). No axillary lymphadenopathy. The thyroid, trachea, and esophagus are within normal limits. Lungs/Pleura: Airspace consolidation within the medial aspect of the apical segment of the right upper lobe. Additional patchy consolidation within the posterior segment of the right upper lobe. Minimal ground-glass opacity within the posterior basal segment of the left lower lobe. No pleural effusion or pneumothorax. Musculoskeletal: No chest wall abnormality. No acute or significant osseous findings. Review of the MIP images confirms the above findings. CTA ABDOMEN AND PELVIS FINDINGS VASCULAR Aorta: Normal caliber aorta without aneurysm, dissection,  vasculitis or significant stenosis. Celiac: Patent without evidence of aneurysm, dissection, vasculitis or significant stenosis. SMA: Patent without evidence of aneurysm, dissection, vasculitis or significant stenosis. Renals: Both renal arteries are patent without evidence of aneurysm, dissection, vasculitis, fibromuscular dysplasia or significant stenosis. There are 2 right and 1 left renal arteries. IMA: Patent without evidence of aneurysm, dissection, vasculitis or significant stenosis. Inflow: Patent without evidence of aneurysm, dissection, vasculitis or significant stenosis. Veins: No obvious venous abnormality within the limitations of this arterial phase study. Review of the MIP images confirms the above findings. NON-VASCULAR Hepatobiliary: No focal liver abnormality is seen. No gallstones, gallbladder wall thickening, or biliary dilatation. Pancreas: Unremarkable. No pancreatic ductal dilatation or surrounding inflammatory changes. Spleen: Normal in size without focal abnormality. A small splenule is noted along the anterior margin of the spleen. Adrenals/Urinary Tract: Unremarkable adrenal glands. Kidneys enhance symmetrically without focal lesion, stone, or hydronephrosis. Ureters are nondilated. Urinary bladder appears unremarkable. Stomach/Bowel: Stomach is within normal limits. Appendix appears normal (series 6, image 178). No evidence of bowel wall thickening, distention, or inflammatory changes. Lymphatic: No abdominopelvic lymphadenopathy. Mildly enlarged left inguinal lymph node measuring 1.7 cm short axis (series 6, image 242). Borderline enlarged right inguinal lymph node measuring 10 mm (series 6, image 228). Reproductive: Prostate is unremarkable. Other: No free fluid. No abdominopelvic fluid collection. No pneumoperitoneum. No abdominal wall hernia. Musculoskeletal: Prior L4-5 posterior and interbody fusion. No acute osseous abnormality. Review of the MIP images confirms the above findings.  IMPRESSION: 1. No evidence of aortic dissection or aneurysm. 2. Findings compatible with multifocal pneumonia, most confluent within the right upper lobe. 3. Mildly prominent mediastinal and right hilar lymph nodes, likely reactive. 4. No acute findings within the abdomen or pelvis. 5. Mildly enlarged bilateral inguinal lymph nodes, nonspecific and possibly reactive. Electronically Signed   By: Davina Poke D.O.   On: 03/16/2021 13:24    EKG: Independently reviewed.  Sinus tachycardia at 122 bpm.  Baseline wander.  Assessment/Plan Principal Problem:   Chest pain Active Problems:   HTN (hypertension)   PNA (pneumonia)   Chest pain Pneumonia > Patient presented with 3 days of chest pain worse today.  Chest pain radiated to his back and was associated with shortness of breath as well. > In the ED troponin, lactic acid were negative.  CTA dissection study was negative for dissection or aneurysm but did shows findings consistent with multifocal pneumonia.  Also mild likely reactive lymphadenopathy of the mediastinum and inguinal lymph nodes. > Does not have significant leukocytosis, could be early pneumonia versus viral.  Flu and COVID screening is negative.  Will check for additional possible viral etiology with viral panel and work to confirm possible bacterial etiology versus viral with procalcitonin. > Started on ceftriaxone and azithromycin in the ED. > Plan to further evaluate with CTA angio PE study however this had to be scheduled for tomorrow due to degree of contrast received for CTA dissection study.  Patient started on treatment dose Lovenox while awaiting results of the study. > POCUS in the ED was negative for pericardial effusion.  EDP spoke with cardiology who recommended formal echo and observation and to reconsult if abnormalities on formal echo. - Patient being monitored on telemetry - Continue with ceftriaxone and azithromycin for now - Echocardiogram - Continue with CTA PE  study as scheduled - Procalcitonin, respiratory viral panel - Continue with treatment dose Lovenox - Trend fever curve and white count  Hypertension Asthma Bipolar Depression Migraine Psoriasis > These problems are listed in chart but not currently on any medications. > He states he needs to reestablish with PCP.  DVT prophylaxis: Treatment dose Lovenox Code Status:   Full Family Communication:  None on admission, patient states he has been in contact with his parents. Disposition Plan:   Patient is from:  Home  Anticipated DC to:  Home  Anticipated DC date:  1 to 2 days  Anticipated DC barriers: None  Consults called:  None (EDP had a conversation with cardiology per signout but no formal consult) Admission status:  Observation, telemetry  Severity of Illness: The appropriate patient status for this patient is OBSERVATION. Observation status is judged to be reasonable and necessary in order to provide the required intensity of service to ensure the patient's safety. The patient's presenting symptoms, physical exam findings, and initial radiographic and laboratory data in the context of their medical condition is felt to place them at decreased risk for further clinical deterioration. Furthermore, it is anticipated that the patient will be medically stable for discharge from the hospital within 2 midnights of admission.    Marcelyn Bruins MD [facility]  How to contact the [facility] Attending or Consulting provider [place] or covering provider during after hours [place], for this patient?   Check the care team in [facility] and look for a) attending/consulting [facility] provider listed and b) the [facility] team listed Log into [email or web address] and use [place]'s universal password to access. If you do not have the password, please contact the hospital operator. Locate the [facility] provider you are looking for under [facility] and page to a number that you can be directly reached. If you still  have difficulty reaching the provider, please page the [facility] (Director on Call) for the Hospitalists listed on amion for assistance.  03/16/2021, 7:41 PM

## 2021-03-16 NOTE — ED Notes (Signed)
Started IL NS bolus per verbal order EDP

## 2021-03-16 NOTE — ED Triage Notes (Signed)
Pt arrives pov, c/o radiating CP x 3 days, endorses shob, denies fever. Pt diaphoretic in triage.

## 2021-03-17 ENCOUNTER — Inpatient Hospital Stay (HOSPITAL_COMMUNITY): Payer: Medicare HMO

## 2021-03-17 DIAGNOSIS — J189 Pneumonia, unspecified organism: Secondary | ICD-10-CM | POA: Diagnosis present

## 2021-03-17 DIAGNOSIS — K2100 Gastro-esophageal reflux disease with esophagitis, without bleeding: Secondary | ICD-10-CM | POA: Diagnosis not present

## 2021-03-17 DIAGNOSIS — Z20822 Contact with and (suspected) exposure to covid-19: Secondary | ICD-10-CM | POA: Diagnosis present

## 2021-03-17 DIAGNOSIS — I208 Other forms of angina pectoris: Secondary | ICD-10-CM

## 2021-03-17 DIAGNOSIS — I10 Essential (primary) hypertension: Secondary | ICD-10-CM | POA: Diagnosis not present

## 2021-03-17 DIAGNOSIS — Z6841 Body Mass Index (BMI) 40.0 and over, adult: Secondary | ICD-10-CM | POA: Diagnosis not present

## 2021-03-17 DIAGNOSIS — Z91013 Allergy to seafood: Secondary | ICD-10-CM | POA: Diagnosis not present

## 2021-03-17 DIAGNOSIS — K219 Gastro-esophageal reflux disease without esophagitis: Secondary | ICD-10-CM | POA: Diagnosis present

## 2021-03-17 DIAGNOSIS — R079 Chest pain, unspecified: Secondary | ICD-10-CM

## 2021-03-17 DIAGNOSIS — Z8249 Family history of ischemic heart disease and other diseases of the circulatory system: Secondary | ICD-10-CM | POA: Diagnosis not present

## 2021-03-17 DIAGNOSIS — R0789 Other chest pain: Secondary | ICD-10-CM | POA: Diagnosis present

## 2021-03-17 LAB — CBC
HCT: 42.1 % (ref 39.0–52.0)
Hemoglobin: 14 g/dL (ref 13.0–17.0)
MCH: 27.7 pg (ref 26.0–34.0)
MCHC: 33.3 g/dL (ref 30.0–36.0)
MCV: 83.2 fL (ref 80.0–100.0)
Platelets: 261 10*3/uL (ref 150–400)
RBC: 5.06 MIL/uL (ref 4.22–5.81)
RDW: 13.3 % (ref 11.5–15.5)
WBC: 6.5 10*3/uL (ref 4.0–10.5)
nRBC: 0 % (ref 0.0–0.2)

## 2021-03-17 LAB — ECHOCARDIOGRAM COMPLETE
Area-P 1/2: 2.81 cm2
Height: 77 in
S' Lateral: 3.3 cm
Weight: 5911.86 oz

## 2021-03-17 LAB — BASIC METABOLIC PANEL
Anion gap: 8 (ref 5–15)
BUN: 9 mg/dL (ref 6–20)
CO2: 27 mmol/L (ref 22–32)
Calcium: 8.5 mg/dL — ABNORMAL LOW (ref 8.9–10.3)
Chloride: 102 mmol/L (ref 98–111)
Creatinine, Ser: 1.07 mg/dL (ref 0.61–1.24)
GFR, Estimated: >60 mL/min (ref >60)
Glucose, Bld: 112 mg/dL — ABNORMAL HIGH (ref 70–99)
Potassium: 3.7 mmol/L (ref 3.5–5.1)
Sodium: 137 mmol/L (ref 135–145)

## 2021-03-17 MED ORDER — ALBUTEROL SULFATE (2.5 MG/3ML) 0.083% IN NEBU
2.5000 mg | INHALATION_SOLUTION | RESPIRATORY_TRACT | Status: DC | PRN
  Administered 2021-03-17 (×2): 2.5 mg via RESPIRATORY_TRACT
  Filled 2021-03-17 (×2): qty 3

## 2021-03-17 MED ORDER — ALBUTEROL SULFATE HFA 108 (90 BASE) MCG/ACT IN AERS: 1.0000 | INHALATION_SPRAY | RESPIRATORY_TRACT | Status: DC | PRN

## 2021-03-17 MED ORDER — PANTOPRAZOLE SODIUM 40 MG PO TBEC
40.0000 mg | DELAYED_RELEASE_TABLET | Freq: Every day | ORAL | Status: DC
  Administered 2021-03-17 – 2021-03-18 (×2): 40 mg via ORAL
  Filled 2021-03-17 (×2): qty 1

## 2021-03-17 MED ORDER — PERFLUTREN LIPID MICROSPHERE
1.0000 mL | INTRAVENOUS | Status: AC | PRN
  Administered 2021-03-17: 2 mL via INTRAVENOUS
  Filled 2021-03-17: qty 10

## 2021-03-17 MED ORDER — ENOXAPARIN SODIUM 80 MG/0.8ML IJ SOSY
80.0000 mg | PREFILLED_SYRINGE | INTRAMUSCULAR | Status: DC
  Administered 2021-03-17 – 2021-03-18 (×2): 80 mg via SUBCUTANEOUS
  Filled 2021-03-17 (×2): qty 0.8

## 2021-03-17 NOTE — Assessment & Plan Note (Addendum)
-  No fevers or chills, procalcitonin less than 0.1, lactic acid 1.0.  No leukocytosis.  CTA chest however shows airspace consolidation in right upper lobe apical posterior segment and left lower lobe -De-escalate to Keflex and azithromycin for discharge

## 2021-03-17 NOTE — Assessment & Plan Note (Addendum)
-   Atypical, initially presented with 10/10 chest pain radiating to the back which prompted CTA chest abdomen pelvis, negative for aortic dissection/aneurysm. -Patient was placed on therapeutic Lovenox overnight, discussed with radiology on-call today, there is no filling defects or pulmonary embolism seen on the CTA chest exam -DC therapeutic Lovenox, place on DVT prophylaxis dose -Patient also reported GERD symptoms, placed on Protonix 40 mg daily.  Recommended to continue PPI for 2 weeks.  If no improvement, should discuss with PCP. -Troponins 2-2, EDP discussed with cardiology and was recommended 2D echo, pending -CTA chest also showed airspace consolidation in the right upper lobe apical and posterior segment and posterior basal segment of left lower lobe.  Continue antibiotics -Chest pain resolved

## 2021-03-17 NOTE — Assessment & Plan Note (Signed)
-   Patient reported GERD like symptoms, intermittently takes ibuprofen for chronic back pain -Placed on Protonix 40 mg daily

## 2021-03-17 NOTE — Progress Notes (Addendum)
[facility]                                                                               Darren Oconnor, is a 31 y.o. male, DOB - 08/10/1990, [id number] Admit date - 03/16/2021    Outpatient Primary MD for the patient is Houston Siren., MD  LOS - 0  days    Brief summary   Patient is a 31 year old male with history of hypertension, asthma, bipolar, depression presented with chest pain.  Patient reported 3 days of ongoing chest pain radiating to the back.  Chest pain was significantly worse on the day of admission which caused him to seek medical attention.  He also reported some shortness of breath, headache.  Pain has also been somewhat reproducible with pressure on the chest. CTA chest abdomen pelvis showed no evidence of dissection or aneurysm or PE.  It did show changes in the lungs consistent with multifocal pneumonia, mild mediastinal lymphadenopathy likely reactive.  Point-of-care ultrasound performed in ED was negative for pericardial effusion. EDP spoke with cardiology recommended formal echo and to reconsult if abnormal.   Assessment & Plan    Assessment and Plan: * Chest pain- (present on admission) - Atypical, initially presented with 10/10 chest pain radiating to the back which prompted CTA chest abdomen pelvis, negative for aortic dissection/aneurysm. -Patient was placed on therapeutic Lovenox overnight, discussed with radiology on-call today, there is no filling defects or pulmonary embolism seen on the CTA chest exam -DC therapeutic Lovenox, place on DVT prophylaxis dose -Patient also reported GERD symptoms, placed on Protonix 40 mg daily.  Recommended to continue PPI for 2 weeks.  If no improvement, should discuss with PCP. -Troponins 2-2, EDP discussed with cardiology and was recommended 2D echo, pending -CTA chest also showed airspace consolidation in the right upper lobe apical and posterior segment and posterior basal segment of left lower lobe.   Continue IV antibiotics -Chest pain improving  Community-acquired pneumonia - No fevers or chills, procalcitonin less than 0.1, lactic acid 1.0.  No leukocytosis.  CTA chest however shows airspace consolidation in right upper lobe apical posterior segment and left lower lobe -Continue IV Zithromax and Rocephin.  HTN (hypertension)- (present on admission) - BP mostly stable, not on any antihypertensives  Obesity, Class III, BMI 40-49.9 (morbid obesity) ([place]) Estimated body mass index is 43.82 kg/m as calculated from the following:   Height as of this encounter: 6\' 5"  (1.956 m).   Weight as of this encounter: 167.6 kg.  GERD (gastroesophageal reflux disease)- (present on admission) - Patient reported GERD like symptoms, intermittently takes ibuprofen for chronic back pain -Placed on Protonix 40 mg daily   Code Status: Full code DVT Prophylaxis:    Prophylactic dose of Lovenox   Level of Care: Level of care: Telemetry Medical Family Communication: Updated patient's girlfriend at the bedside  Disposition Plan:     Remains inpatient appropriate: 2D echo pending, on IV antibiotics, hopefully DC home in a.m.  Procedures:  CTA chest abdomen pelvis Consultants:   Cardiology consulted by EDP  Antimicrobials:  IV Zithromax 03/16/2021---> IV Rocephin 03/16/2021--->  Medications   enoxaparin (LOVENOX) injection  80 mg Subcutaneous  Q24H   pantoprazole  40 mg Oral Q0600   sodium chloride flush  3 mL Intravenous Q12H     Subjective:   Darren Oconnor was seen and examined today.  Chest pain is improving and not as severe when presented to ED.  No nausea vomiting, shortness of breath, diaphoresis.    Objective:   Vitals:   03/16/21 2014 03/16/21 2340 03/17/21 0459 03/17/21 0900  BP: 110/80 131/68 (!) 113/96 (!) 142/91  Pulse: 92 93 88 79  Resp: 20 17 16 16   Temp: 98 F (36.7 C) (!) 97.2 F (36.2 C) 97.8 F (36.6 C) 98.1 F (36.7 C)  TempSrc: Oral Oral Oral Oral  SpO2: 96% 95%  97% 96%  Weight:      Height:        Intake/Output Summary (Last 24 hours) at 03/17/2021 1118 Last data filed at 03/16/2021 1544 Gross per 24 hour  Intake 1350.36 ml  Output 550 ml  Net 800.36 ml   Filed Weights   03/16/21 1552 03/16/21 1750  Weight: (!) 171.6 kg (!) 167.6 kg     Exam General: Alert and oriented x 3, NAD Cardiovascular: S1 S2 auscultated, RRR Respiratory: Clear to auscultation bilaterally, no wheezing, rales or rhonchi Gastrointestinal: Soft, nontender, nondistended, + bowel sounds Ext: no pedal edema bilaterally Neuro: no new deficits   Data Reviewed:  I have personally reviewed following labs    CBC Lab Results  Component Value Date   WBC 6.5 03/17/2021   RBC 5.06 03/17/2021   HGB 14.0 03/17/2021   HCT 42.1 03/17/2021   MCV 83.2 03/17/2021   MCH 27.7 03/17/2021   PLT 261 03/17/2021   MCHC 33.3 03/17/2021   RDW 13.3 03/17/2021   LYMPHSABS 1.3 03/16/2021   MONOABS 0.8 03/16/2021   EOSABS 0.1 03/16/2021   BASOSABS 0.0 0000000     Last metabolic panel Lab Results  Component Value Date   NA 137 03/17/2021   K 3.7 03/17/2021   CL 102 03/17/2021   CO2 27 03/17/2021   BUN 9 03/17/2021   CREATININE 1.07 03/17/2021   GLUCOSE 112 (H) 03/17/2021   GFRNONAA >60 03/17/2021   GFRAA >60 06/16/2014   CALCIUM 8.5 (L) 03/17/2021   PROT 8.1 03/16/2021   ALBUMIN 3.9 03/16/2021   BILITOT 0.6 03/16/2021   ALKPHOS 62 03/16/2021   AST 24 03/16/2021   ALT 23 03/16/2021   ANIONGAP 8 03/17/2021      Radiology Studies: I have personally reviewed the imaging studies  CT Angio Chest/Abd/Pel for Dissection W and/or Wo Contrast  Result Date: 03/16/2021 FINDINGS: CTA CHEST FINDINGS Cardiovascular: Noncontrast CT through the chest demonstrates no evidence of aortic hematoma. Preferential opacification of the thoracic aorta. No evidence of aortic dissection or aneurysm. Three vessel arch. Central pulmonary vasculature is within normal limits. No central  filling defects. Heart size is normal. No pericardial effusion. Mediastinum/Nodes: Multiple mildly prominent mediastinal and right hilar lymph nodes. Reference nodes include 10 mm pretracheal node (series 6, image 34) and 14 mm right hilar node (series 6, image 40). Borderline enlarged left hilar lymph node measuring 9 mm (series 6, image 49). No axillary lymphadenopathy. The thyroid, trachea, and esophagus are within normal limits. Lungs/Pleura: Airspace consolidation within the medial aspect of the apical segment of the right upper lobe. Additional patchy consolidation within the posterior segment of the right upper lobe. Minimal ground-glass opacity within the posterior basal segment of the left lower lobe. No pleural effusion or pneumothorax. Musculoskeletal: No chest wall  abnormality. No acute or significant osseous findings. Review of the MIP images confirms the above findings. CTA ABDOMEN AND PELVIS FINDINGS VASCULAR Aorta: Normal caliber aorta without aneurysm, dissection, vasculitis or significant stenosis. Celiac: Patent without evidence of aneurysm, dissection, vasculitis or significant stenosis. SMA: Patent without evidence of aneurysm, dissection, vasculitis or significant stenosis. Renals: Both renal arteries are patent without evidence of aneurysm, dissection, vasculitis, fibromuscular dysplasia or significant stenosis. There are 2 right and 1 left renal arteries. IMA: Patent without evidence of aneurysm, dissection, vasculitis or significant stenosis. Inflow: Patent without evidence of aneurysm, dissection, vasculitis or significant stenosis. Veins: No obvious venous abnormality within the limitations of this arterial phase study. Review of the MIP images confirms the above findings. NON-VASCULAR Hepatobiliary: No focal liver abnormality is seen. No gallstones, gallbladder wall thickening, or biliary dilatation. Pancreas: Unremarkable. No pancreatic ductal dilatation or surrounding inflammatory  changes. Spleen: Normal in size without focal abnormality. A small splenule is noted along the anterior margin of the spleen. Adrenals/Urinary Tract: Unremarkable adrenal glands. Kidneys enhance symmetrically without focal lesion, stone, or hydronephrosis. Ureters are nondilated. Urinary bladder appears unremarkable. Stomach/Bowel: Stomach is within normal limits. Appendix appears normal (series 6, image 178). No evidence of bowel wall thickening, distention, or inflammatory changes. Lymphatic: No abdominopelvic lymphadenopathy. Mildly enlarged left inguinal lymph node measuring 1.7 cm short axis (series 6, image 242). Borderline enlarged right inguinal lymph node measuring 10 mm (series 6, image 228). Reproductive: Prostate is unremarkable. Other: No free fluid. No abdominopelvic fluid collection. No pneumoperitoneum. No abdominal wall hernia. Musculoskeletal: Prior L4-5 posterior and interbody fusion. No acute osseous abnormality. Review of the MIP images confirms the above findings. IMPRESSION: 1. No evidence of aortic dissection or aneurysm. 2. Findings compatible with multifocal pneumonia, most confluent within the right upper lobe. 3. Mildly prominent mediastinal and right hilar lymph nodes, likely reactive. 4. No acute findings within the abdomen or pelvis. 5. Mildly enlarged bilateral inguinal lymph nodes, nonspecific and possibly reactive. Electronically Signed   By: Davina Poke D.O.   On: 03/16/2021 13:24       Kahla Risdon M.D. [facility] 03/17/2021, 11:18 AM  Available via Epic secure chat 7am-7pm After 7 pm, please refer to night coverage provider listed on amion.

## 2021-03-17 NOTE — Assessment & Plan Note (Signed)
Estimated body mass index is 43.82 kg/m as calculated from the following:   Height as of this encounter: 6\' 5"  (1.956 m).   Weight as of this encounter: 167.6 kg.

## 2021-03-17 NOTE — Assessment & Plan Note (Addendum)
-   BP mostly stable, not on any antihypertensives -On day of discharge, blood pressure 128/85

## 2021-03-17 NOTE — Hospital Course (Addendum)
Patient is a 31 year old male with history of hypertension, asthma, bipolar, depression presented with chest pain.  Patient reported 3 days of ongoing chest pain radiating to the back.  Chest pain was significantly worse on the day of admission which caused him to seek medical attention.  He also reported some shortness of breath, headache.  Pain has also been somewhat reproducible with pressure on the chest. CTA chest abdomen pelvis showed no evidence of dissection or aneurysm or PE.  It did show changes in the lungs consistent with multifocal pneumonia, mild mediastinal lymphadenopathy likely reactive.  Point-of-care ultrasound performed in ED was negative for pericardial effusion. EDP spoke with cardiology recommended formal echo and to reconsult if abnormal.  Echocardiogram resulted without acute abnormality.  Chest pain resolved.  Patient was treated with antibiotics for pneumonia.  On day of discharge, patient denied any chest pain or worsening shortness of breath.  He remained stable on room air.

## 2021-03-18 DIAGNOSIS — R0789 Other chest pain: Secondary | ICD-10-CM

## 2021-03-18 MED ORDER — AZITHROMYCIN 500 MG PO TABS: 500.0000 mg | ORAL_TABLET | Freq: Every day | ORAL | 0 refills | Status: AC

## 2021-03-18 MED ORDER — PANTOPRAZOLE SODIUM 40 MG PO TBEC: 40.0000 mg | DELAYED_RELEASE_TABLET | Freq: Every day | ORAL | 0 refills | Status: DC

## 2021-03-18 MED ORDER — CEPHALEXIN 500 MG PO CAPS: 500.0000 mg | ORAL_CAPSULE | Freq: Four times a day (QID) | ORAL | 0 refills | Status: AC

## 2021-03-18 NOTE — Discharge Summary (Signed)
Physician Discharge Summary   Patient: Darren Oconnor MRN: [id number] DOB: [birth year]  Admit date:     03/16/2021  Discharge date: 03/18/21  Discharge Physician: Dessa Phi   PCP: Houston Siren., MD   Recommendations at discharge:   Follow-up with primary care physician  Discharge Diagnoses: Principal Problem:   Chest pain Active Problems:   HTN (hypertension)   Community-acquired pneumonia   GERD (gastroesophageal reflux disease)   Obesity, Class III, BMI 40-49.9 (morbid obesity) ([place])  Resolved Problems:   * No resolved hospital problems. [facility] Course: Patient is a 31 year old male with history of hypertension, asthma, bipolar, depression presented with chest pain.  Patient reported 3 days of ongoing chest pain radiating to the back.  Chest pain was significantly worse on the day of admission which caused him to seek medical attention.  He also reported some shortness of breath, headache.  Pain has also been somewhat reproducible with pressure on the chest. CTA chest abdomen pelvis showed no evidence of dissection or aneurysm or PE.  It did show changes in the lungs consistent with multifocal pneumonia, mild mediastinal lymphadenopathy likely reactive.  Point-of-care ultrasound performed in ED was negative for pericardial effusion. EDP spoke with cardiology recommended formal echo and to reconsult if abnormal.  Echocardiogram resulted without acute abnormality.  Chest pain resolved.  Patient was treated with antibiotics for pneumonia.  On day of discharge, patient denied any chest pain or worsening shortness of breath.  He remained stable on room air.  Assessment and Plan: * Chest pain- (present on admission) - Atypical, initially presented with 10/10 chest pain radiating to the back which prompted CTA chest abdomen pelvis, negative for aortic dissection/aneurysm. -Patient was placed on therapeutic Lovenox overnight, discussed with radiology on-call today, there is  no filling defects or pulmonary embolism seen on the CTA chest exam -DC therapeutic Lovenox, place on DVT prophylaxis dose -Patient also reported GERD symptoms, placed on Protonix 40 mg daily.  Recommended to continue PPI for 2 weeks.  If no improvement, should discuss with PCP. -Troponins 2-2, EDP discussed with cardiology and was recommended 2D echo, pending -CTA chest also showed airspace consolidation in the right upper lobe apical and posterior segment and posterior basal segment of left lower lobe.  Continue antibiotics -Chest pain resolved  Obesity, Class III, BMI 40-49.9 (morbid obesity) (HCC) Estimated body mass index is 43.82 kg/m as calculated from the following:   Height as of this encounter: 6\' 5"  (1.956 m).   Weight as of this encounter: 167.6 kg.  GERD (gastroesophageal reflux disease)- (present on admission) - Patient reported GERD like symptoms, intermittently takes ibuprofen for chronic back pain -Placed on Protonix 40 mg daily  Community-acquired pneumonia -No fevers or chills, procalcitonin less than 0.1, lactic acid 1.0.  No leukocytosis.  CTA chest however shows airspace consolidation in right upper lobe apical posterior segment and left lower lobe -De-escalate to Keflex and azithromycin for discharge  HTN (hypertension)- (present on admission) - BP mostly stable, not on any antihypertensives -On day of discharge, blood pressure 128/85           Consultants: None Procedures performed: None   Disposition: Home Diet recommendation:  Regular diet  DISCHARGE MEDICATION: Allergies as of 03/18/2021       Reactions   Fish-derived Products Anaphylaxis   Shellfish Allergy Anaphylaxis        Medication List     STOP taking these medications    famotidine 20 MG tablet Commonly known  as: PEPCID       TAKE these medications    azithromycin 500 MG tablet Commonly known as: Zithromax Take 1 tablet (500 mg total) by mouth daily for 3 days. Take 1  tablet daily for 3 days.   cephALEXin 500 MG capsule Commonly known as: KEFLEX Take 1 capsule (500 mg total) by mouth 4 (four) times daily for 3 days.   pantoprazole 40 MG tablet Commonly known as: PROTONIX Take 1 tablet (40 mg total) by mouth daily.        Follow-up Information     Houston Siren., MD. Schedule an appointment as soon as possible for a visit in 1 week(s).   Specialty: Family Medicine Contact information: [address] [place] [address] [phone]                 Discharge Exam: Danley Danker Weights   03/16/21 1552 03/16/21 1750 03/18/21 0500  Weight: (!) 171.6 kg (!) 167.6 kg (!) 167.5 kg   Examination: General exam: Appears calm and comfortable  Respiratory system: Clear to auscultation. Respiratory effort normal. On room air  Cardiovascular system: S1 & S2 heard, RRR. No pedal edema. Gastrointestinal system: Abdomen is nondistended, soft and nontender. Normal bowel sounds heard. Central nervous system: Alert and oriented. Non focal exam. Speech clear  Extremities: Symmetric in appearance bilaterally  Skin: No rashes, lesions or ulcers on exposed skin  Psychiatry: Judgement and insight appear stable. Mood & affect appropriate.    Condition at discharge: stable  The results of significant diagnostics from this hospitalization (including imaging, microbiology, ancillary and laboratory) are listed below for reference.   Imaging Studies: ECHOCARDIOGRAM COMPLETE  Result Date: 03/17/2021    ECHOCARDIOGRAM REPORT   Patient Name:   Darren Oconnor Date of Exam: 03/17/2021 Medical Rec #:  [id number]     Height:       77.0 in Accession #:    [id number]    Weight:       369.5 lb Date of Birth:  September 02, 1990    BSA:          2.904 m Patient Age:    30 years      BP:           142/91 mmHg Patient Gender: M             HR:           84 bpm. Exam Location:  Inpatient Procedure: 2D Echo, Cardiac Doppler, Color Doppler and Intracardiac            Opacification  Agent Indications:    Chest Pain R07.9  History:        Patient has no prior history of Echocardiogram examinations.                 Risk Factors:Hypertension. Pneumonia.  Sonographer:    Darlina Sicilian RDCS Referring Phys: [id number] [place]  1. Left ventricular ejection fraction, by estimation, is 60 to 65%. The left ventricle has normal function. The left ventricle has no regional wall motion abnormalities. Left ventricular diastolic parameters were normal.  2. Right ventricular systolic function is normal. The right ventricular size is normal. Tricuspid regurgitation signal is inadequate for assessing PA pressure.  3. The mitral valve is normal in structure. No evidence of mitral valve regurgitation. No evidence of mitral stenosis.  4. The aortic valve is normal in structure. Aortic valve regurgitation is not visualized. No aortic stenosis is present.  5. The  inferior vena cava is normal in size with greater than 50% respiratory variability, suggesting right atrial pressure of 3 mmHg. FINDINGS  Left Ventricle: Left ventricular ejection fraction, by estimation, is 60 to 65%. The left ventricle has normal function. The left ventricle has no regional wall motion abnormalities. Definity contrast agent was given IV to delineate the left ventricular  endocardial borders. The left ventricular internal cavity size was normal in size. There is no left ventricular hypertrophy. Left ventricular diastolic parameters were normal. Normal left ventricular filling pressure. Right Ventricle: The right ventricular size is normal. No increase in right ventricular wall thickness. Right ventricular systolic function is normal. Tricuspid regurgitation signal is inadequate for assessing PA pressure. Left Atrium: Left atrial size was normal in size. Right Atrium: Right atrial size was normal in size. Pericardium: There is no evidence of pericardial effusion. Mitral Valve: The mitral valve is normal in structure. No  evidence of mitral valve regurgitation. No evidence of mitral valve stenosis. Tricuspid Valve: The tricuspid valve is normal in structure. Tricuspid valve regurgitation is not demonstrated. No evidence of tricuspid stenosis. Aortic Valve: The aortic valve is normal in structure. Aortic valve regurgitation is not visualized. No aortic stenosis is present. Pulmonic Valve: The pulmonic valve was normal in structure. Pulmonic valve regurgitation is not visualized. No evidence of pulmonic stenosis. Aorta: The aortic root is normal in size and structure. Venous: The inferior vena cava is normal in size with greater than 50% respiratory variability, suggesting right atrial pressure of 3 mmHg. IAS/Shunts: No atrial level shunt detected by color flow Doppler.  LEFT VENTRICLE PLAX 2D LVIDd:         4.90 cm   Diastology LVIDs:         3.30 cm   LV e' medial:    13.20 cm/s LV PW:         1.10 cm   LV E/e' medial:  7.8 LV IVS:        1.10 cm   LV e' lateral:   18.60 cm/s LVOT diam:     2.00 cm   LV E/e' lateral: 5.5 LV SV:         64 LV SV Index:   22 LVOT Area:     3.14 cm  RIGHT VENTRICLE RV S prime:     16.90 cm/s TAPSE (M-mode): 2.2 cm LEFT ATRIUM             Index LA diam:        3.40 cm 1.17 cm/m LA Vol (A2C):   23.9 ml 8.23 ml/m LA Vol (A4C):   30.2 ml 10.40 ml/m LA Biplane Vol: 27.1 ml 9.33 ml/m  AORTIC VALVE LVOT Vmax:   105.00 cm/s LVOT Vmean:  80.000 cm/s LVOT VTI:    0.205 m  AORTA Ao Root diam: 3.50 cm Ao Asc diam:  3.40 cm MITRAL VALVE MV Area (PHT): 2.81 cm     SHUNTS MV Decel Time: 270 msec     Systemic VTI:  0.20 m MV E velocity: 103.00 cm/s  Systemic Diam: 2.00 cm MV A velocity: 71.80 cm/s MV E/A ratio:  1.43 Fransico Him MD Electronically signed by Fransico Him MD Signature Date/Time: 03/17/2021/3:03:02 PM    Final    CT Angio Chest/Abd/Pel for Dissection W and/or Wo Contrast  Result Date: 03/16/2021 CLINICAL DATA:  Chest pain, shortness of breath, diaphoresis. Clinical concern for aortic dissection  EXAM: CT ANGIOGRAPHY CHEST, ABDOMEN AND PELVIS TECHNIQUE: Non-contrast CT of the chest was initially  obtained. Multidetector CT imaging through the chest, abdomen and pelvis was performed using the standard protocol during bolus administration of intravenous contrast. Multiplanar reconstructed images and MIPs were obtained and reviewed to evaluate the vascular anatomy. RADIATION DOSE REDUCTION: This exam was performed according to the departmental dose-optimization program which includes automated exposure control, adjustment of the mA and/or kV according to patient size and/or use of iterative reconstruction technique. CONTRAST:  112mL OMNIPAQUE IOHEXOL 350 MG/ML SOLN COMPARISON:  CT abdomen pelvis 03/11/2019 FINDINGS: CTA CHEST FINDINGS Cardiovascular: Noncontrast CT through the chest demonstrates no evidence of aortic hematoma. Preferential opacification of the thoracic aorta. No evidence of aortic dissection or aneurysm. Three vessel arch. Central pulmonary vasculature is within normal limits. No central filling defects. Heart size is normal. No pericardial effusion. Mediastinum/Nodes: Multiple mildly prominent mediastinal and right hilar lymph nodes. Reference nodes include 10 mm pretracheal node (series 6, image 34) and 14 mm right hilar node (series 6, image 40). Borderline enlarged left hilar lymph node measuring 9 mm (series 6, image 49). No axillary lymphadenopathy. The thyroid, trachea, and esophagus are within normal limits. Lungs/Pleura: Airspace consolidation within the medial aspect of the apical segment of the right upper lobe. Additional patchy consolidation within the posterior segment of the right upper lobe. Minimal ground-glass opacity within the posterior basal segment of the left lower lobe. No pleural effusion or pneumothorax. Musculoskeletal: No chest wall abnormality. No acute or significant osseous findings. Review of the MIP images confirms the above findings. CTA ABDOMEN AND PELVIS  FINDINGS VASCULAR Aorta: Normal caliber aorta without aneurysm, dissection, vasculitis or significant stenosis. Celiac: Patent without evidence of aneurysm, dissection, vasculitis or significant stenosis. SMA: Patent without evidence of aneurysm, dissection, vasculitis or significant stenosis. Renals: Both renal arteries are patent without evidence of aneurysm, dissection, vasculitis, fibromuscular dysplasia or significant stenosis. There are 2 right and 1 left renal arteries. IMA: Patent without evidence of aneurysm, dissection, vasculitis or significant stenosis. Inflow: Patent without evidence of aneurysm, dissection, vasculitis or significant stenosis. Veins: No obvious venous abnormality within the limitations of this arterial phase study. Review of the MIP images confirms the above findings. NON-VASCULAR Hepatobiliary: No focal liver abnormality is seen. No gallstones, gallbladder wall thickening, or biliary dilatation. Pancreas: Unremarkable. No pancreatic ductal dilatation or surrounding inflammatory changes. Spleen: Normal in size without focal abnormality. A small splenule is noted along the anterior margin of the spleen. Adrenals/Urinary Tract: Unremarkable adrenal glands. Kidneys enhance symmetrically without focal lesion, stone, or hydronephrosis. Ureters are nondilated. Urinary bladder appears unremarkable. Stomach/Bowel: Stomach is within normal limits. Appendix appears normal (series 6, image 178). No evidence of bowel wall thickening, distention, or inflammatory changes. Lymphatic: No abdominopelvic lymphadenopathy. Mildly enlarged left inguinal lymph node measuring 1.7 cm short axis (series 6, image 242). Borderline enlarged right inguinal lymph node measuring 10 mm (series 6, image 228). Reproductive: Prostate is unremarkable. Other: No free fluid. No abdominopelvic fluid collection. No pneumoperitoneum. No abdominal wall hernia. Musculoskeletal: Prior L4-5 posterior and interbody fusion. No acute  osseous abnormality. Review of the MIP images confirms the above findings. IMPRESSION: 1. No evidence of aortic dissection or aneurysm. 2. Findings compatible with multifocal pneumonia, most confluent within the right upper lobe. 3. Mildly prominent mediastinal and right hilar lymph nodes, likely reactive. 4. No acute findings within the abdomen or pelvis. 5. Mildly enlarged bilateral inguinal lymph nodes, nonspecific and possibly reactive. Electronically Signed   By: Davina Poke D.O.   On: 03/16/2021 13:24    Microbiology: Results for orders  placed or performed during the hospital encounter of 03/16/21  Resp Panel by RT-PCR (Flu A&B, Covid) Nasopharyngeal Swab     Status: None   Collection Time: 03/16/21  1:10 PM   Specimen: Nasopharyngeal Swab; Nasopharyngeal(NP) swabs in vial transport medium  Result Value Ref Range Status   SARS Coronavirus 2 by RT PCR NEGATIVE NEGATIVE Final    Comment: (NOTE) SARS-CoV-2 target nucleic acids are NOT DETECTED.  The SARS-CoV-2 RNA is generally detectable in upper respiratory specimens during the acute phase of infection. The lowest concentration of SARS-CoV-2 viral copies this assay can detect is 138 copies/mL. A negative result does not preclude SARS-Cov-2 infection and should not be used as the sole basis for treatment or other patient management decisions. A negative result may occur with  improper specimen collection/handling, submission of specimen other than nasopharyngeal swab, presence of viral mutation(s) within the areas targeted by this assay, and inadequate number of viral copies(<138 copies/mL). A negative result must be combined with clinical observations, patient history, and epidemiological information. The expected result is Negative.  Fact Sheet for Patients:  EntrepreneurPulse.com.au  Fact Sheet for Healthcare Providers:  IncredibleEmployment.be  This test is no t yet approved or cleared by  the [place] FDA and  has been authorized for detection and/or diagnosis of SARS-CoV-2 by FDA under an Emergency Use Authorization (EUA). This EUA will remain  in effect (meaning this test can be used) for the duration of the COVID-19 declaration under Section 564(b)(1) of the Act, 21 U.S.C.section 360bbb-3(b)(1), unless the authorization is terminated  or revoked sooner.       Influenza A by PCR NEGATIVE NEGATIVE Final   Influenza B by PCR NEGATIVE NEGATIVE Final    Comment: (NOTE) The Xpert Xpress SARS-CoV-2/FLU/RSV plus assay is intended as an aid in the diagnosis of influenza from Nasopharyngeal swab specimens and should not be used as a sole basis for treatment. Nasal washings and aspirates are unacceptable for Xpert Xpress SARS-CoV-2/FLU/RSV testing.  Fact Sheet for Patients: EntrepreneurPulse.com.au  Fact Sheet for Healthcare Providers: IncredibleEmployment.be  This test is not yet approved or cleared by the [place] FDA and has been authorized for detection and/or diagnosis of SARS-CoV-2 by FDA under an Emergency Use Authorization (EUA). This EUA will remain in effect (meaning this test can be used) for the duration of the COVID-19 declaration under Section 564(b)(1) of the Act, 21 U.S.C. section 360bbb-3(b)(1), unless the authorization is terminated or revoked.  Performed at [facility], [address] [place], [place] [address]     Labs: CBC: Recent Labs  Lab 03/16/21 1240 03/16/21 1247 03/17/21 0206  WBC 7.8  --  6.5  NEUTROABS 5.7  --   --   HGB 14.9 16.0 14.0  HCT 45.2 47.0 42.1  MCV 84.0  --  83.2  PLT 272  --  0000000   Basic Metabolic Panel: Recent Labs  Lab 03/16/21 1240 03/16/21 1247 03/17/21 0206  NA 134* 138 137  K 3.8 4.4 3.7  CL 96*  --  102  CO2 29  --  27  GLUCOSE 142*  --  112*  BUN 9  --  9  CREATININE 1.11  --  1.07  CALCIUM 9.1  --  8.5*   Liver Function Tests: Recent  Labs  Lab 03/16/21 1240  AST 24  ALT 23  ALKPHOS 62  BILITOT 0.6  PROT 8.1  ALBUMIN 3.9   CBG: No results for input(s): GLUCAP in the last 168  hours.  Discharge time spent: greater than 30 minutes.  Signed: Dessa Phi, DO [facility] 03/18/2021

## 2021-03-18 NOTE — TOC Transition Note (Signed)
Transition of Care (TOC) - CM/SW Discharge Note [name] RN, BSN Transitions of Care Unit 4E- RN Case Manager See Treatment Team for direct phone #    Patient Details  Name: Darren Oconnor MRN: [id number] Date of Birth: [birth year]  Transition of Care [facility]) CM/SW Contact:  Dawayne Patricia, RN Phone Number: 03/18/2021, 10:39 AM   Clinical Narrative:    Transition of Care Department [facility]) has reviewed patient and no TOC needs have been identified at this time. Pt stable for transition home today.   Final next level of care: Home/Self Care Barriers to Discharge: No Barriers Identified   Patient Goals and CMS Choice     Choice offered to / list presented to : NA  Discharge Placement             Home          Discharge Plan and Services                DME Arranged: N/A DME Agency: NA       HH Arranged: NA HH Agency: NA        Social Determinants of Health (SDOH) Interventions     Readmission Risk Interventions Readmission Risk Prevention Plan 03/18/2021  Post Dischage Appt Complete  Medication Screening Complete  Transportation Screening Complete  Some recent data might be hidden

## 2021-11-14 ENCOUNTER — Encounter (HOSPITAL_BASED_OUTPATIENT_CLINIC_OR_DEPARTMENT_OTHER): Payer: Self-pay

## 2021-11-14 ENCOUNTER — Other Ambulatory Visit: Payer: Self-pay

## 2021-11-14 ENCOUNTER — Emergency Department (HOSPITAL_BASED_OUTPATIENT_CLINIC_OR_DEPARTMENT_OTHER)
Admission: EM | Admit: 2021-11-14 | Discharge: 2021-11-15 | Disposition: A | Discharge status: Home / Self Care | Payer: Worker's Compensation | Attending: Emergency Medicine | Admitting: Emergency Medicine

## 2021-11-14 DIAGNOSIS — M79641 Pain in right hand: Secondary | ICD-10-CM | POA: Diagnosis present

## 2021-11-14 DIAGNOSIS — Y990 Civilian activity done for income or pay: Secondary | ICD-10-CM | POA: Diagnosis not present

## 2021-11-14 DIAGNOSIS — M7989 Other specified soft tissue disorders: Secondary | ICD-10-CM | POA: Diagnosis not present

## 2021-11-14 DIAGNOSIS — L539 Erythematous condition, unspecified: Secondary | ICD-10-CM | POA: Diagnosis not present

## 2021-11-14 DIAGNOSIS — T7589XA Other specified effects of external causes, initial encounter: Secondary | ICD-10-CM | POA: Diagnosis not present

## 2021-11-14 DIAGNOSIS — Z77098 Contact with and (suspected) exposure to other hazardous, chiefly nonmedicinal, chemicals: Secondary | ICD-10-CM

## 2021-11-14 MED ORDER — CALCIUM GLUCONATE TOPICAL GEL 2.5 %
Freq: Once | TOPICAL | Status: AC
  Administered 2021-11-14: 1 via TOPICAL
  Filled 2021-11-14: qty 25

## 2021-11-14 NOTE — ED Provider Notes (Signed)
[place] EMERGENCY DEPARTMENT Provider Note   CSN: [id number] Arrival date & time: 11/14/21  1855     History  Chief Complaint  Patient presents with   Chemical Exposure    ZAIDE KARDELL is a 31 y.o. male.  Patient presents to the emergency department complaining of right hand pain after unknown chemical exposure.  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  He states he had burning in his palm and some swelling to the hand.  He complains of pain which is severe at this time.  Past medical history significant for headache, hypertension, asthma  HPI     Home Medications Prior to Admission medications   Medication Sig Start Date End Date Taking? Authorizing Provider  pantoprazole (PROTONIX) 40 MG tablet Take 1 tablet (40 mg total) by mouth daily. 03/18/21   Dessa Phi, DO      Allergies    Fish-derived products and Shellfish allergy    Review of Systems   Review of Systems  Skin:  Positive for rash.    Physical Exam Updated Vital Signs BP (!) 128/98 (BP Location: Left Arm)   Pulse 99   Resp 18   Ht 6\' 5"  (1.956 m)   Wt (!) 167.4 kg   SpO2 96%   BMI 43.76 kg/m  Physical Exam HENT:     Head: Normocephalic and atraumatic.  Eyes:     Pupils: Pupils are equal, round, and reactive to light.  Pulmonary:     Effort: Pulmonary effort is normal. No respiratory distress.  Musculoskeletal:        General: Swelling (Mild right hand swelling) present. No signs of injury.     Cervical back: Normal range of motion.  Skin:    General: Skin is dry.     Findings: Erythema (Mild erythema on right palm) present. Rash: Very minimal rashes seen in this below.. Neurological:     Mental Status: He is alert.  Psychiatric:        Speech: Speech normal.        Behavior: Behavior normal.     ED Results / Procedures / Treatments   Labs (all labs ordered are listed, but only abnormal results are  displayed) Labs Reviewed  BASIC METABOLIC PANEL  MAGNESIUM    EKG None  Radiology No results found.  Procedures Procedures    Medications Ordered in ED Medications  calcium gluconate topical gel 2.5 % (1 Application Topical Given 11/14/21 2311)    ED Course/ Medical Decision Making/ A&P                           Medical Decision Making Amount and/or Complexity of Data Reviewed Labs: ordered.   Patient parents with a chief complaint of chemical exposure to his right hand.  Unfortunately the patient has an unknown chemical exposure.  Patient's supervisor tried to find out what the chemical was but was unable to find out exactly what was on the builders glove.  Poison control was notified.  With the unknown nature of the exposure they recommended symptomatic treatment for possible hydrofluoric acid exposure.  They recommended calcium gluconate applied to the patient's hand.  The also recommended labs including a basic metabolic panel and magnesium level.  Patient care being transferred to Dr. Randal Buba at shift handoff. Plan to discharge home if labs are normal. If labs are abnormal reconsult  with poison control for further recommendations.         Final Clinical Impression(s) / ED Diagnoses Final diagnoses:  Chemical exposure    Rx / DC Orders ED Discharge Orders     None         Ronny Bacon 11/15/21 0024    Tegeler, Gwenyth Allegra, MD 11/15/21 1526

## 2021-11-14 NOTE — ED Triage Notes (Signed)
Reports got some type of motor oil on right hand.  Mild swelling noted, no burn noted.

## 2021-11-14 NOTE — ED Notes (Signed)
Writer called poison control. Writer instructed that hydrofluoric acid is not out of the realm of possibilities and that we should check BMP and magnesium levels as well as put 10 ounces KY jelly mixed with 28ml Calcium Gluconate 10% in a glove for patient to massage into his palm on affected area. Writer informed that if calcium and magnesium levels come back normal then patient is okay to be discharged but that if Calcium is "significantly low" to call back for further instructions. Writer passed instructions onto [employer], [place].

## 2021-11-15 LAB — BASIC METABOLIC PANEL
Anion gap: 8 (ref 5–15)
BUN: 11 mg/dL (ref 6–20)
CO2: 29 mmol/L (ref 22–32)
Calcium: 9.6 mg/dL (ref 8.9–10.3)
Chloride: 102 mmol/L (ref 98–111)
Creatinine, Ser: 1.13 mg/dL (ref 0.61–1.24)
GFR, Estimated: >60 mL/min (ref >60)
Glucose, Bld: 88 mg/dL (ref 70–99)
Potassium: 3.9 mmol/L (ref 3.5–5.1)
Sodium: 139 mmol/L (ref 135–145)

## 2021-11-15 LAB — MAGNESIUM: Magnesium: 2.1 mg/dL (ref 1.7–2.4)

## 2021-11-15 NOTE — ED Notes (Signed)
Pt's affected hand appears normal upon departure, no swelling noted, range of motion intact, no rash/redness

## 2022-01-24 ENCOUNTER — Other Ambulatory Visit: Payer: Self-pay

## 2022-01-24 ENCOUNTER — Emergency Department (HOSPITAL_COMMUNITY): Payer: No Typology Code available for payment source

## 2022-01-24 ENCOUNTER — Emergency Department (HOSPITAL_COMMUNITY)
Admission: EM | Admit: 2022-01-24 | Discharge: 2022-01-24 | Disposition: A | Discharge status: Home / Self Care | Payer: No Typology Code available for payment source | Attending: Emergency Medicine | Admitting: Emergency Medicine

## 2022-01-24 DIAGNOSIS — R519 Headache, unspecified: Secondary | ICD-10-CM | POA: Diagnosis not present

## 2022-01-24 DIAGNOSIS — W228XXA Striking against or struck by other objects, initial encounter: Secondary | ICD-10-CM | POA: Insufficient documentation

## 2022-01-24 DIAGNOSIS — I10 Essential (primary) hypertension: Secondary | ICD-10-CM | POA: Diagnosis not present

## 2022-01-24 DIAGNOSIS — S060XAA Concussion with loss of consciousness status unknown, initial encounter: Secondary | ICD-10-CM

## 2022-01-24 DIAGNOSIS — R791 Abnormal coagulation profile: Secondary | ICD-10-CM | POA: Insufficient documentation

## 2022-01-24 DIAGNOSIS — S0990XA Unspecified injury of head, initial encounter: Secondary | ICD-10-CM

## 2022-01-24 DIAGNOSIS — J0110 Acute frontal sinusitis, unspecified: Secondary | ICD-10-CM

## 2022-01-24 LAB — PROTIME-INR
INR: 1 (ref 0.8–1.2)
Prothrombin Time: 13.5 seconds (ref 11.4–15.2)

## 2022-01-24 LAB — COMPREHENSIVE METABOLIC PANEL
ALT: 26 U/L (ref 0–44)
AST: 30 U/L (ref 15–41)
Albumin: 4.3 g/dL (ref 3.5–5.0)
Alkaline Phosphatase: 59 U/L (ref 38–126)
Anion gap: 14 (ref 5–15)
BUN: 7 mg/dL (ref 6–20)
CO2: 20 mmol/L — ABNORMAL LOW (ref 22–32)
Calcium: 9.3 mg/dL (ref 8.9–10.3)
Chloride: 102 mmol/L (ref 98–111)
Creatinine, Ser: 1.11 mg/dL (ref 0.61–1.24)
GFR, Estimated: >60 mL/min (ref >60)
Glucose, Bld: 94 mg/dL (ref 70–99)
Potassium: 4.4 mmol/L (ref 3.5–5.1)
Sodium: 136 mmol/L (ref 135–145)
Total Bilirubin: 0.9 mg/dL (ref 0.3–1.2)
Total Protein: 7.9 g/dL (ref 6.5–8.1)

## 2022-01-24 LAB — ETHANOL: Alcohol, Ethyl (B): <10 mg/dL (ref <10)

## 2022-01-24 LAB — I-STAT CHEM 8, ED
BUN: 10 mg/dL (ref 6–20)
Calcium, Ion: 1.01 mmol/L — ABNORMAL LOW (ref 1.15–1.40)
Chloride: 104 mmol/L (ref 98–111)
Creatinine, Ser: 1.1 mg/dL (ref 0.61–1.24)
Glucose, Bld: 95 mg/dL (ref 70–99)
HCT: 50 % (ref 39.0–52.0)
Hemoglobin: 17 g/dL (ref 13.0–17.0)
Potassium: 4.2 mmol/L (ref 3.5–5.1)
Sodium: 139 mmol/L (ref 135–145)
TCO2: 26 mmol/L (ref 22–32)

## 2022-01-24 LAB — SAMPLE TO BLOOD BANK

## 2022-01-24 LAB — LACTIC ACID, PLASMA: Lactic Acid, Venous: 2.1 mmol/L (ref 0.5–1.9)

## 2022-01-24 MED ORDER — HYDROMORPHONE HCL 1 MG/ML IJ SOLN
1.0000 mg | Freq: Once | INTRAMUSCULAR | Status: AC
  Administered 2022-01-24: 1 mg via INTRAVENOUS

## 2022-01-24 MED ORDER — ACETAMINOPHEN 500 MG PO TABS
1000.0000 mg | ORAL_TABLET | Freq: Once | ORAL | Status: AC
  Administered 2022-01-24: 1000 mg via ORAL
  Filled 2022-01-24: qty 2

## 2022-01-24 MED ORDER — AMOXICILLIN-POT CLAVULANATE 875-125 MG PO TABS: 1.0000 | ORAL_TABLET | Freq: Two times a day (BID) | ORAL | 0 refills | Status: DC

## 2022-01-24 MED ORDER — HYDROMORPHONE HCL 1 MG/ML IJ SOLN
INTRAMUSCULAR | Status: AC
  Filled 2022-01-24: qty 1

## 2022-01-24 NOTE — ED Provider Notes (Signed)
[facility] EMERGENCY DEPARTMENT Provider Note   CSN: [id number] Arrival date & time: 01/24/22  0040     History  Chief Complaint  Patient presents with   Head Injury    Darren Oconnor is a 32 y.o. male.  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  Patient apparently worked for 1 to 2 hours after get hit in the head but has amnesia to the whole event and anterograde amnesia as well.  He is alert now but disoriented to situation and time.  No obvious trauma to the head.  Hypertensive and tachycardic with EMS.  Has a history of hypertension but unknown medication.  No known blood thinners. Patient only complains of a severe headache in frontal area. No other pain elsewhere. No known allergies.         Home Medications Prior to Admission medications   Medication Sig Start Date End Date Taking? Authorizing Provider  pantoprazole (PROTONIX) 40 MG tablet Take 1 tablet (40 mg total) by mouth daily. 03/18/21   Noralee Stain, DO      Allergies    Fish-derived products and Shellfish allergy    Review of Systems   Review of Systems  Physical Exam Updated Vital Signs BP (!) 146/89   Pulse 94   Temp 98.8 F (37.1 C)   Resp 19   Ht 6\' 5"  (1.956 m)   Wt 136.1 kg   SpO2 100%   BMI 35.57 kg/m  Physical Exam Vitals and nursing note reviewed.  Constitutional:      Appearance: He is well-developed.  HENT:     Head: Normocephalic and atraumatic.     Mouth/Throat:     Mouth: Mucous membranes are moist.     Pharynx: Oropharynx is clear.  Eyes:     Pupils: Pupils are equal, round, and reactive to light.  Cardiovascular:     Rate and Rhythm: Normal rate.  Pulmonary:     Effort: Pulmonary effort is normal. No respiratory distress.  Abdominal:     General: There is no distension.  Musculoskeletal:        General: Normal range of motion.     Cervical back: Normal range of motion.  Skin:    General: Skin is warm and dry.   Neurological:     Mental Status: He is alert. He is disoriented.     Comments: Moves all extremities. Have to force eyes open but pupils ERRL, difficult to ascertain rectus muscle and alignment 2/2 photophobia     ED Results / Procedures / Treatments   Labs (all labs ordered are listed, but only abnormal results are displayed) Labs Reviewed  COMPREHENSIVE METABOLIC PANEL - Abnormal; Notable for the following components:      Result Value   CO2 20 (*)    All other components within normal limits  I-STAT CHEM 8, ED - Abnormal; Notable for the following components:   Calcium, Ion 1.01 (*)    All other components within normal limits  ETHANOL  URINALYSIS, ROUTINE W REFLEX MICROSCOPIC  LACTIC ACID, PLASMA  PROTIME-INR  CBC  SAMPLE TO BLOOD BANK    EKG None  Radiology CT HEAD WO CONTRAST  Result Date: 01/24/2022 CLINICAL DATA:  Head trauma, moderate-severe; Polytrauma, blunt; Facial trauma, blunt EXAM: CT HEAD WITHOUT CONTRAST CT MAXILLOFACIAL WITHOUT CONTRAST CT CERVICAL SPINE WITHOUT CONTRAST TECHNIQUE: Multidetector CT imaging of the head, cervical spine, and maxillofacial structures were performed using the standard  protocol without intravenous contrast. Multiplanar CT image reconstructions of the cervical spine and maxillofacial structures were also generated. RADIATION DOSE REDUCTION: This exam was performed according to the departmental dose-optimization program which includes automated exposure control, adjustment of the mA and/or kV according to patient size and/or use of iterative reconstruction technique. COMPARISON:  None Available. FINDINGS: CT HEAD FINDINGS Brain: No evidence of large-territorial acute infarction. No parenchymal hemorrhage. No mass lesion. No extra-axial collection. No mass effect or midline shift. No hydrocephalus. Basilar cisterns are patent. Vascular: No hyperdense vessel. Skull: No acute fracture or focal lesion. Other: None. CT MAXILLOFACIAL FINDINGS  Osseous: No fracture or mandibular dislocation. No destructive process. Sinuses/Orbits: Almost complete opacification of the right maxillary sinus. Partial right mastoid air cell effusion with fluid noted within the right middle ear. Otherwise visualized paranasal sinuses and left mastoid air cells are clear. The orbits are unremarkable. Soft tissues: Negative. CT CERVICAL SPINE FINDINGS Alignment: Normal. Skull base and vertebrae: No acute fracture. No aggressive appearing focal osseous lesion or focal pathologic process. Soft tissues and spinal canal: No prevertebral fluid or swelling. No visible canal hematoma. Upper chest: Unremarkable. Other: None. IMPRESSION: 1. No acute intracranial abnormality. 2. No acute displaced facial fracture. 3. No acute displaced fracture or traumatic listhesis of the cervical spine. 4. Almost complete opacification of the right maxillary sinus. Partial right mastoid air cell effusion with fluid noted within the right middle ear. Correlate clinically for infection. Electronically Signed   By: Iven Finn M.D.   On: 01/24/2022 01:21   CT MAXILLOFACIAL WO CONTRAST  Result Date: 01/24/2022 CLINICAL DATA:  Head trauma, moderate-severe; Polytrauma, blunt; Facial trauma, blunt EXAM: CT HEAD WITHOUT CONTRAST CT MAXILLOFACIAL WITHOUT CONTRAST CT CERVICAL SPINE WITHOUT CONTRAST TECHNIQUE: Multidetector CT imaging of the head, cervical spine, and maxillofacial structures were performed using the standard protocol without intravenous contrast. Multiplanar CT image reconstructions of the cervical spine and maxillofacial structures were also generated. RADIATION DOSE REDUCTION: This exam was performed according to the departmental dose-optimization program which includes automated exposure control, adjustment of the mA and/or kV according to patient size and/or use of iterative reconstruction technique. COMPARISON:  None Available. FINDINGS: CT HEAD FINDINGS Brain: No evidence of  large-territorial acute infarction. No parenchymal hemorrhage. No mass lesion. No extra-axial collection. No mass effect or midline shift. No hydrocephalus. Basilar cisterns are patent. Vascular: No hyperdense vessel. Skull: No acute fracture or focal lesion. Other: None. CT MAXILLOFACIAL FINDINGS Osseous: No fracture or mandibular dislocation. No destructive process. Sinuses/Orbits: Almost complete opacification of the right maxillary sinus. Partial right mastoid air cell effusion with fluid noted within the right middle ear. Otherwise visualized paranasal sinuses and left mastoid air cells are clear. The orbits are unremarkable. Soft tissues: Negative. CT CERVICAL SPINE FINDINGS Alignment: Normal. Skull base and vertebrae: No acute fracture. No aggressive appearing focal osseous lesion or focal pathologic process. Soft tissues and spinal canal: No prevertebral fluid or swelling. No visible canal hematoma. Upper chest: Unremarkable. Other: None. IMPRESSION: 1. No acute intracranial abnormality. 2. No acute displaced facial fracture. 3. No acute displaced fracture or traumatic listhesis of the cervical spine. 4. Almost complete opacification of the right maxillary sinus. Partial right mastoid air cell effusion with fluid noted within the right middle ear. Correlate clinically for infection. Electronically Signed   By: Iven Finn M.D.   On: 01/24/2022 01:21   CT CERVICAL SPINE WO CONTRAST  Result Date: 01/24/2022 CLINICAL DATA:  Head trauma, moderate-severe; Polytrauma, blunt; Facial trauma, blunt EXAM: CT  HEAD WITHOUT CONTRAST CT MAXILLOFACIAL WITHOUT CONTRAST CT CERVICAL SPINE WITHOUT CONTRAST TECHNIQUE: Multidetector CT imaging of the head, cervical spine, and maxillofacial structures were performed using the standard protocol without intravenous contrast. Multiplanar CT image reconstructions of the cervical spine and maxillofacial structures were also generated. RADIATION DOSE REDUCTION: This exam was  performed according to the departmental dose-optimization program which includes automated exposure control, adjustment of the mA and/or kV according to patient size and/or use of iterative reconstruction technique. COMPARISON:  None Available. FINDINGS: CT HEAD FINDINGS Brain: No evidence of large-territorial acute infarction. No parenchymal hemorrhage. No mass lesion. No extra-axial collection. No mass effect or midline shift. No hydrocephalus. Basilar cisterns are patent. Vascular: No hyperdense vessel. Skull: No acute fracture or focal lesion. Other: None. CT MAXILLOFACIAL FINDINGS Osseous: No fracture or mandibular dislocation. No destructive process. Sinuses/Orbits: Almost complete opacification of the right maxillary sinus. Partial right mastoid air cell effusion with fluid noted within the right middle ear. Otherwise visualized paranasal sinuses and left mastoid air cells are clear. The orbits are unremarkable. Soft tissues: Negative. CT CERVICAL SPINE FINDINGS Alignment: Normal. Skull base and vertebrae: No acute fracture. No aggressive appearing focal osseous lesion or focal pathologic process. Soft tissues and spinal canal: No prevertebral fluid or swelling. No visible canal hematoma. Upper chest: Unremarkable. Other: None. IMPRESSION: 1. No acute intracranial abnormality. 2. No acute displaced facial fracture. 3. No acute displaced fracture or traumatic listhesis of the cervical spine. 4. Almost complete opacification of the right maxillary sinus. Partial right mastoid air cell effusion with fluid noted within the right middle ear. Correlate clinically for infection. Electronically Signed   By: Iven Finn M.D.   On: 01/24/2022 01:21    Procedures Procedures    Medications Ordered in ED Medications  HYDROmorphone (DILAUDID) injection 1 mg (0 mg Intravenous Hold 01/24/22 0107)  acetaminophen (TYLENOL) tablet 1,000 mg (1,000 mg Oral Given 01/24/22 0134)    ED Course/ Medical Decision Making/  A&P                           Medical Decision Making Amount and/or Complexity of Data Reviewed Labs: ordered. Radiology: ordered.  Risk OTC drugs. Prescription drug management.  Concussion vs subdural. Ct and reeval with pain meds.  Headache improved. Oriented now. Remembers parts of the incident.   Ct head/neck/face interpreted by myself without obvious skull fracture or bleed. Per radiology does show fluid in sinus c/w likely sinusitis. He states he has had some symptoms there recently so will start antibiotics. Discussed post concussion precautions. Understands. Will write work note. Pending a ride to be discharged.  Final Clinical Impression(s) / ED Diagnoses Final diagnoses:  None    Rx / DC Orders ED Discharge Orders     None         Derrika Ruffalo, Corene Cornea, MD 01/24/22 0225

## 2022-01-24 NOTE — ED Notes (Signed)
EDP at bedside  

## 2022-01-24 NOTE — Progress Notes (Signed)
Orthopedic Tech Progress Note Patient Details:  Darren Oconnor 1990-09-09 [id number]  Patient ID: [name], male   DOB: [birth year], 32 y.o.   MRN: [id number] I attended trauma page. Karolee Stamps 01/24/2022, 7:18 AM

## 2022-01-24 NOTE — ED Notes (Signed)
No XR per EDP [facility]

## 2022-01-24 NOTE — ED Notes (Signed)
Discharge instructions discussed with pt. Pt vebralized understanding with no questions at this time. Pt going home with mother. Wheelchair to car by NT

## 2022-01-24 NOTE — ED Notes (Signed)
Pt transported to CT by [facility] at this time

## 2022-01-24 NOTE — ED Notes (Signed)
Trauma Response Nurse Documentation   Darren Oconnor is a 32 y.o. male arriving to [facility] ED via EMS  On No antithrombotic. Trauma was activated as a Level 2 by ED charge RN based on the following trauma criteria GCS 10-14 associated with trauma or AVPU < A. Trauma team at the bedside on patient arrival.   Patient cleared for CT by Dr. Dayna Barker EDP. Pt transported to CT with trauma response nurse present to monitor. RN remained with the patient throughout their absence from the department for clinical observation.   GCS 14, confused.  History   Past Medical History:  Diagnosis Date   Asthma    Headache(784.0)    Hypertension      Past Surgical History:  Procedure Laterality Date   BACK SURGERY     TESTICLE SURGERY         Initial Focused Assessment (If applicable, or please see trauma documentation): Alert/confused male presents via EMS from work after a pallet fell on his head. Event was not witnessed, per EMS, happened several hours ago and pt resumed working. Unsure of weight of pallet, if pt had LOC. Pt does not recall event, GCS 14 and c/o severe headache Airway unobstructed/patent, BS clear No obvious uncontrolled hemorrhage, no wounds noted GCS 14  CT's Completed:   CT Head, CT Maxillofacial, and CT C-Spine   Interventions:  IV start and trauma lab draw XRAYS deferred by Dr. Dayna Barker CT head, max face, c-spine [place] J collar placed on arrival Dilaudid for pain control  Plan for disposition:  Discharge home anticipated  Consults completed:  none at the time of this note.  Event Summary: Presents via EMS with head injury, per EMS a pallet fell on his head. Unsure if LOC occurred, pt went back to work for several hours. Pt does not recall events and was disoriented to year for EMS, alert x4 in ED. Arrives with towel roll around neck. [place] J placed. No obvious wounds/hematomas noted. IV established, escorted to CT.    Bedside handoff with ED RN Mel Almond.     Jovian Lembcke O Adrie Picking  Trauma Response RN  Please call TRN at [phone] for further assistance.

## 2022-01-24 NOTE — ED Triage Notes (Signed)
Pt arrives via [facility] from work, [employer], as a Level 2 Trauma d/t head injury. Per report, pt was working this afternoon when a pallet struck the front of his head. No deformity noted. C collar replaced on arrival. GCS 14. Pt is unable to recall events

## 2022-07-05 ENCOUNTER — Emergency Department (HOSPITAL_BASED_OUTPATIENT_CLINIC_OR_DEPARTMENT_OTHER)
Admission: EM | Admit: 2022-07-05 | Discharge: 2022-07-05 | Disposition: A | Discharge status: Home / Self Care | Payer: 59 | Attending: Emergency Medicine | Admitting: Emergency Medicine

## 2022-07-05 ENCOUNTER — Emergency Department (HOSPITAL_BASED_OUTPATIENT_CLINIC_OR_DEPARTMENT_OTHER): Payer: 59

## 2022-07-05 ENCOUNTER — Encounter (HOSPITAL_BASED_OUTPATIENT_CLINIC_OR_DEPARTMENT_OTHER): Payer: Self-pay | Admitting: Emergency Medicine

## 2022-07-05 ENCOUNTER — Other Ambulatory Visit: Payer: Self-pay

## 2022-07-05 DIAGNOSIS — K921 Melena: Secondary | ICD-10-CM | POA: Diagnosis not present

## 2022-07-05 DIAGNOSIS — R112 Nausea with vomiting, unspecified: Secondary | ICD-10-CM | POA: Diagnosis not present

## 2022-07-05 DIAGNOSIS — K219 Gastro-esophageal reflux disease without esophagitis: Secondary | ICD-10-CM | POA: Diagnosis not present

## 2022-07-05 DIAGNOSIS — J45909 Unspecified asthma, uncomplicated: Secondary | ICD-10-CM | POA: Insufficient documentation

## 2022-07-05 DIAGNOSIS — R739 Hyperglycemia, unspecified: Secondary | ICD-10-CM | POA: Diagnosis not present

## 2022-07-05 DIAGNOSIS — K602 Anal fissure, unspecified: Secondary | ICD-10-CM | POA: Diagnosis not present

## 2022-07-05 DIAGNOSIS — R0602 Shortness of breath: Secondary | ICD-10-CM | POA: Insufficient documentation

## 2022-07-05 DIAGNOSIS — I10 Essential (primary) hypertension: Secondary | ICD-10-CM | POA: Diagnosis not present

## 2022-07-05 DIAGNOSIS — R61 Generalized hyperhidrosis: Secondary | ICD-10-CM | POA: Insufficient documentation

## 2022-07-05 DIAGNOSIS — Z7951 Long term (current) use of inhaled steroids: Secondary | ICD-10-CM | POA: Insufficient documentation

## 2022-07-05 DIAGNOSIS — R079 Chest pain, unspecified: Secondary | ICD-10-CM

## 2022-07-05 DIAGNOSIS — R0789 Other chest pain: Secondary | ICD-10-CM | POA: Diagnosis present

## 2022-07-05 HISTORY — DX: Bipolar disorder, unspecified: F31.9

## 2022-07-05 HISTORY — DX: Dorsalgia, unspecified: M54.9

## 2022-07-05 LAB — CBC
HCT: 41.5 % (ref 39.0–52.0)
Hemoglobin: 14 g/dL (ref 13.0–17.0)
MCH: 28 pg (ref 26.0–34.0)
MCHC: 33.7 g/dL (ref 30.0–36.0)
MCV: 83 fL (ref 80.0–100.0)
Platelets: 313 10*3/uL (ref 150–400)
RBC: 5 MIL/uL (ref 4.22–5.81)
RDW: 13.1 % (ref 11.5–15.5)
WBC: 10 10*3/uL (ref 4.0–10.5)
nRBC: 0 % (ref 0.0–0.2)

## 2022-07-05 LAB — D-DIMER, QUANTITATIVE: D-Dimer, Quant: 0.27 ug/mL-FEU (ref 0.00–0.50)

## 2022-07-05 LAB — BASIC METABOLIC PANEL
Anion gap: 9 (ref 5–15)
BUN: 9 mg/dL (ref 6–20)
CO2: 25 mmol/L (ref 22–32)
Calcium: 9 mg/dL (ref 8.9–10.3)
Chloride: 102 mmol/L (ref 98–111)
Creatinine, Ser: 1.04 mg/dL (ref 0.61–1.24)
GFR, Estimated: >60 mL/min (ref >60)
Glucose, Bld: 223 mg/dL — ABNORMAL HIGH (ref 70–99)
Potassium: 3.8 mmol/L (ref 3.5–5.1)
Sodium: 136 mmol/L (ref 135–145)

## 2022-07-05 LAB — TROPONIN I (HIGH SENSITIVITY)
Troponin I (High Sensitivity): <2 ng/L (ref <18)
Troponin I (High Sensitivity): 3 ng/L (ref <18)

## 2022-07-05 LAB — OCCULT BLOOD X 1 CARD TO LAB, STOOL: Fecal Occult Bld: POSITIVE — AB

## 2022-07-05 MED ORDER — ALUM & MAG HYDROXIDE-SIMETH 200-200-20 MG/5ML PO SUSP
30.0000 mL | Freq: Once | ORAL | Status: AC
  Administered 2022-07-05: 30 mL via ORAL
  Filled 2022-07-05: qty 30

## 2022-07-05 MED ORDER — FAMOTIDINE 20 MG PO TABS: 20.0000 mg | ORAL_TABLET | Freq: Two times a day (BID) | ORAL | 0 refills | Status: AC

## 2022-07-05 MED ORDER — LORAZEPAM 1 MG PO TABS
1.0000 mg | ORAL_TABLET | Freq: Once | ORAL | Status: AC
  Administered 2022-07-05: 1 mg via ORAL
  Filled 2022-07-05: qty 1

## 2022-07-05 MED ORDER — PANTOPRAZOLE SODIUM 40 MG PO TBEC: 40.0000 mg | DELAYED_RELEASE_TABLET | Freq: Every day | ORAL | 0 refills | Status: AC

## 2022-07-05 MED ORDER — LIDOCAINE VISCOUS HCL 2 % MT SOLN
15.0000 mL | Freq: Once | OROMUCOSAL | Status: AC
  Administered 2022-07-05: 15 mL via ORAL
  Filled 2022-07-05: qty 15

## 2022-07-05 NOTE — ED Triage Notes (Signed)
Central chest pain radiating to left arm for 2 hours.  Some sob.  Some diaphoresis.  No N/V.  Pt states he had blood when he had a BM last night.

## 2022-07-05 NOTE — ED Provider Notes (Signed)
[facility] Provider Note   CSN: [id number] Arrival date & time: 07/05/22  0755     History  Chief Complaint  Patient presents with   Chest Pain    Darren Oconnor is a 32 y.o. male.   Chest Pain    32 year old male with medical history significant for asthma, HTN, bipolar disorder, GERD who presents to the emergency department with multiple complaints.  The patient states that he woke up this morning with chest pain, described as both a tightness and a sharp pain that radiates up to his left shoulder and arm.  No radiation to the back.  No ripping or tearing component.  No fevers, chills or cough.  Additionally, the patient has noted that for the last 3 bowel movements he has had bloody bowel movements.  He states that he has had bright red blood on top of his stools.  The most recent bowel movement this morning was bloody with blood on top of the stool and in the toilet bowl noted.  He also noted blood when he wiped.  He endorses rectal pain.  Endorses mild diaphoresis and some shortness of breath, denies any nausea or vomiting.  Home Medications Prior to Admission medications   Medication Sig Start Date End Date Taking? Authorizing Provider  albuterol (VENTOLIN HFA) 108 (90 Base) MCG/ACT inhaler Inhale into the lungs. 03/25/21  Yes [provider]  buPROPion (WELLBUTRIN XL) 150 MG 24 hr tablet Take by mouth. 06/25/22 06/25/23 Yes [provider]  famotidine (PEPCID) 20 MG tablet Take 1 tablet (20 mg total) by mouth 2 (two) times daily. 07/05/22  Yes Ernie Avena, MD  Fluticasone-Umeclidin-Vilant (TRELEGY ELLIPTA) 200-62.5-25 MCG/ACT AEPB Inhale into the lungs. 08/08/21  Yes [provider]  Vitamin D, Ergocalciferol, (DRISDOL) 1.25 MG (50000 UNIT) CAPS capsule Take 1 capsule by mouth once a week. 05/14/22 05/14/23 Yes [provider]  pantoprazole (PROTONIX) 40 MG tablet Take 1 tablet (40 mg total) by mouth  daily. 07/05/22   Ernie Avena, MD      Allergies    Fish-derived products and Shellfish allergy    Review of Systems   Review of Systems  Cardiovascular:  Positive for chest pain.  Gastrointestinal:  Positive for blood in stool and rectal pain.  All other systems reviewed and are negative.   Physical Exam Updated Vital Signs BP (!) 142/95   Pulse 97   Temp 98.6 F (37 C) (Oral)   Resp 20   Ht 6\' 4"  (1.93 m)   Wt (!) 193 kg   SpO2 100%   BMI 51.79 kg/m  Physical Exam Vitals and nursing note reviewed. Exam conducted with a chaperone present.  Constitutional:      General: He is not in acute distress.    Appearance: He is well-developed. He is obese.  HENT:     Head: Normocephalic and atraumatic.  Eyes:     Conjunctiva/sclera: Conjunctivae normal.  Cardiovascular:     Rate and Rhythm: Normal rate and regular rhythm.     Heart sounds: No murmur heard. Pulmonary:     Effort: Pulmonary effort is normal. No respiratory distress.     Breath sounds: Normal breath sounds.  Abdominal:     Palpations: Abdomen is soft.     Tenderness: There is no abdominal tenderness.  Genitourinary:    Rectum: Guaiac result positive. Anal fissure present. No external hemorrhoid or internal hemorrhoid.     Comments: Rectal pain present, exam  somewhat limited by patient body habitus, no hemorrhoids palpated Musculoskeletal:        General: No swelling.     Cervical back: Neck supple.  Skin:    General: Skin is warm and dry.     Capillary Refill: Capillary refill takes less than 2 seconds.  Neurological:     Mental Status: He is alert.  Psychiatric:        Mood and Affect: Mood normal.     ED Results / Procedures / Treatments   Labs (all labs ordered are listed, but only abnormal results are displayed) Labs Reviewed  BASIC METABOLIC PANEL - Abnormal; Notable for the following components:      Result Value   Glucose, Bld 223 (*)    All other components within normal limits  OCCULT  BLOOD X 1 CARD TO LAB, STOOL - Abnormal; Notable for the following components:   Fecal Occult Bld POSITIVE (*)    All other components within normal limits  CBC  D-DIMER, QUANTITATIVE  TROPONIN I (HIGH SENSITIVITY)  TROPONIN I (HIGH SENSITIVITY)    EKG EKG Interpretation  Date/Time:  Saturday July 05 2022 08:04:02 EDT Ventricular Rate:  108 PR Interval:  145 QRS Duration: 93 QT Interval:  313 QTC Calculation: 420 R Axis:   -23 Text Interpretation: Sinus tachycardia Borderline left axis deviation Low voltage, precordial leads Consider anterior infarct Confirmed by Ernie Avena ([id number]) on 07/05/2022 8:27:45 AM  Radiology DG Chest 2 View  Result Date: 07/05/2022 CLINICAL DATA:  Central chest pain EXAM: CHEST - 2 VIEW COMPARISON:  Chest radiograph 04/09/2021 FINDINGS: The heart size and mediastinal contours are within normal limits. Both lungs are clear. The visualized skeletal structures are unremarkable. IMPRESSION: No active cardiopulmonary disease. Electronically Signed   By: Annia Belt M.D.   On: 07/05/2022 08:32    Procedures Procedures    Medications Ordered in ED Medications  alum & mag hydroxide-simeth (MAALOX/MYLANTA) 200-200-20 MG/5ML suspension 30 mL (30 mLs Oral Given 07/05/22 0928)    And  lidocaine (XYLOCAINE) 2 % viscous mouth solution 15 mL (15 mLs Oral Given 07/05/22 0928)  LORazepam (ATIVAN) tablet 1 mg (1 mg Oral Given 07/05/22 1610)    ED Course/ Medical Decision Making/ A&P             HEART Score: 1                Medical Decision Making Amount and/or Complexity of Data Reviewed Labs: ordered. Radiology: ordered.  Risk OTC drugs. Prescription drug management.     32 year old male with medical history significant for asthma, HTN, bipolar disorder, GERD who presents to the emergency department with multiple complaints.  The patient states that he woke up this morning with chest pain, described as both a tightness and a sharp pain that radiates up to  his left shoulder and arm.  No radiation to the back.  No ripping or tearing component.  No fevers, chills or cough.  Additionally, the patient has noted that for the last 3 bowel movements he has had bloody bowel movements.  He states that he has had bright red blood on top of his stools.  The most recent bowel movement this morning was bloody with blood on top of the stool and in the toilet bowl noted.  He also noted blood when he wiped.  He endorses rectal pain.  Endorses mild diaphoresis and some shortness of breath, denies any nausea or vomiting.  Medical Decision Making: Darren Oconnor is  a 32 y.o. male who presented to the ED today with chest pain, detailed above.  Based on patient's comorbidities, patient has a heart score of 1.    Patient placed on continuous vitals and telemetry monitoring while in ED which was reviewed periodically.  Complete initial physical exam performed, notably the patient was vitally stable lungs clear to auscultation, abdomen soft, nontender, evidence of rectal bleeding/hematochezia.   Reviewed and confirmed nursing documentation for past medical history, family history, social history.    Initial Assessment: With the patient's presentation of left-sided chest pain, most likely diagnosis is musculoskeletal chest pain versus GERD, although ACS remains on the differential. Other diagnoses were considered including (but not limited to) pulmonary embolism, community-acquired pneumonia, aortic dissection, pneumothorax, underlying bony abnormality, anemia. These are considered less likely due to history of present illness and physical exam findings.    In particular, concerning pulmonary embolism: Patient is PERC positive and the they deny malignancy, recent surgery, history of DVT, or calf tenderness leading to a low risk Wells score. Aortic Dissection also reconsidered but seems less likely based on the location, quality, onset, and severity of symptoms in this case.    Initial Plan: Evaluate for ACS with delta troponin and EKG evaluated as below  Evaluate for dissection, bony abnormality, or pneumonia with chest x-ray and screening laboratory evaluation including CBC, BMP  Further evaluation for pulmonary embolism indicated at this time based on patient's PERC and Wells score.  Further evaluation for Thoracic Aortic Dissection not indicated at this time based on patient's clinical history and PE findings.   Initial Study Results: EKG was reviewed independently. Rate, rhythm, axis, intervals all examined and without medically relevant abnormality. ST segments without concerns for elevations.   Sinus tachycardia ventricular rate 108 noted  Laboratory  Delta  troponin demonstrated normal values   CBC and BMP without obvious metabolic or inflammatory abnormalities requiring further evaluation.   Radiology  DG Chest 2 View  Result Date: 07/05/2022 CLINICAL DATA:  Central chest pain EXAM: CHEST - 2 VIEW COMPARISON:  Chest radiograph 04/09/2021 FINDINGS: The heart size and mediastinal contours are within normal limits. Both lungs are clear. The visualized skeletal structures are unremarkable. IMPRESSION: No active cardiopulmonary disease. Electronically Signed   By: Annia Belt M.D.   On: 07/05/2022 08:32    Final Assessment and Plan: The patient's troponins were negative x 2, D-dimer was normal, CBC without a leukocytosis or anemia and BMP with hyperglycemia to 223 without electrolyte abnormality, normal renal function, no anion gap acidosis.  Chest x-ray was without acute disease.  The patient was administered Maalox and lidocaine in addition to oral Ativan and felt symptomatically improved.  Symptoms are consistent with likely reflux versus anxiety.  Low concern for ACS, low suspicion for PE with a negative D-dimer.  Heart score of 1.  Will treat with Pepcid and Protonix and have the patient follow-up outpatient.  Regarding the patient's GI bleeding, he has  fecal occult positive, symptoms are consistent with likely anal fissure, hemoglobin stable, vitally stable on repeat assessment, low concern for lower GI bleed or acute GI bleed warranting inpatient observation.  Patient advised sitz baths, outpatient follow-up.  Provided return precautions the event of any worsening symptoms, continuing lower GI bleeding, overall stable for discharge and outpatient follow-up.   Final Clinical Impression(s) / ED Diagnoses Final diagnoses:  Hematochezia  Anal fissure  Chest pain, unspecified type  Gastroesophageal reflux disease, unspecified whether esophagitis present    Rx / DC Orders  ED Discharge Orders          Ordered    famotidine (PEPCID) 20 MG tablet  2 times daily        07/05/22 1108    pantoprazole (PROTONIX) 40 MG tablet  Daily        07/05/22 1108              Ernie Avena, MD 07/05/22 1343

## 2022-07-05 NOTE — Discharge Instructions (Addendum)
Your presentation of lower GI bleeding is concerning for a likely anal fissure.  Recommend sitz bath's and continued monitoring, return to the emergency room if you have any severe persistent rectal bleeding.  Your cardiac enzymes were normal and your symptoms of chest pain are more consistent with gastric reflux

## 2022-08-06 IMAGING — CT CT ANGIO CHEST-ABD-PELV FOR DISSECTION W/ AND WO/W CM
2 of 8 series · 13 of 46 positions shown, 15 images · non-contrast
Comparison: CT abdomen pelvis 03/11/2019

CLINICAL DATA: Chest pain, shortness of breath, diaphoresis.
Clinical concern for aortic dissection

EXAM:
CT ANGIOGRAPHY CHEST, ABDOMEN AND PELVIS
TECHNIQUE: Non-contrast CT of the chest was initially obtained.

[Series 6: axial arterial (person_name) · axial · arterial · 0.89mm/px · z∈[+620,+1274]mm · 10 of 244 slices shown, 12 images]
[im 13/244  soft-tissue]
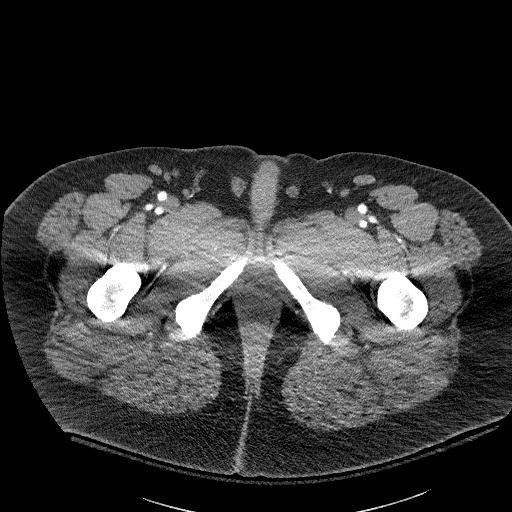
[im 13/244  bone]
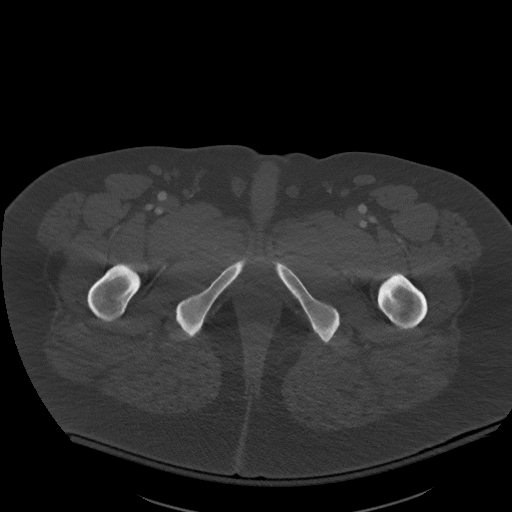
[im 37/244  soft-tissue]
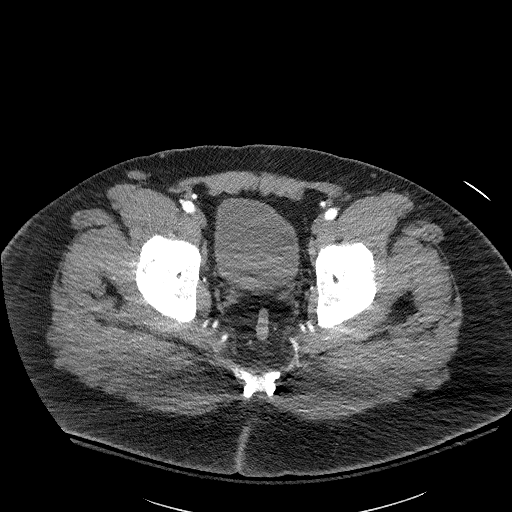
[im 61/244  soft-tissue]
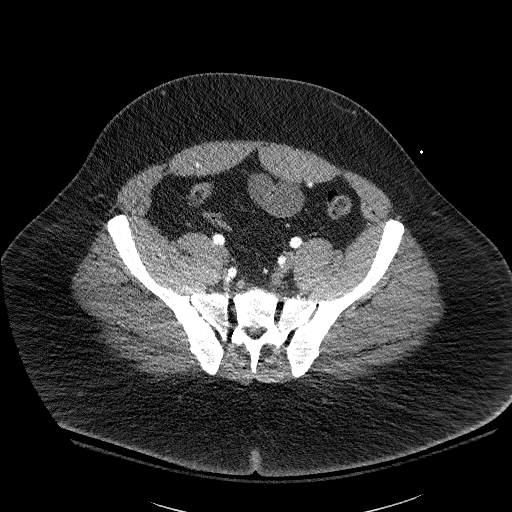
[im 86/244  soft-tissue]
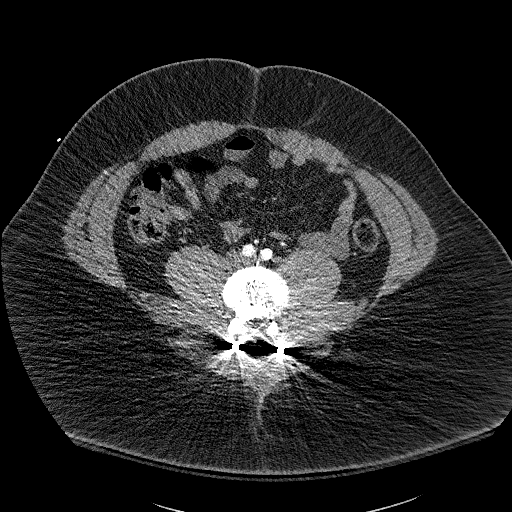
[im 110/244  soft-tissue]
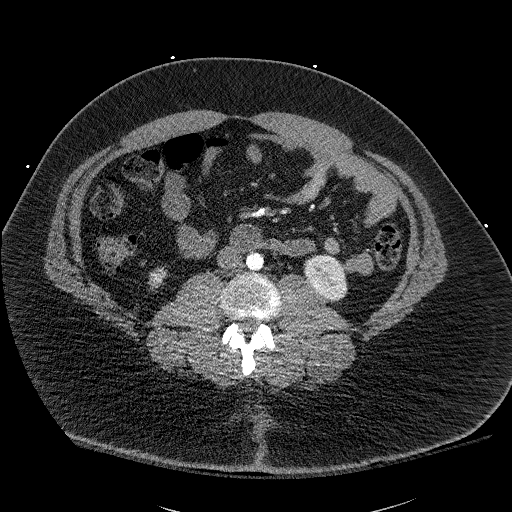
[im 134/244  soft-tissue]
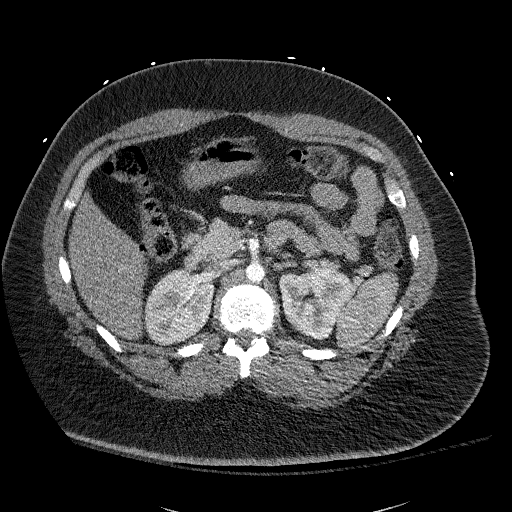
[im 158/244  soft-tissue]
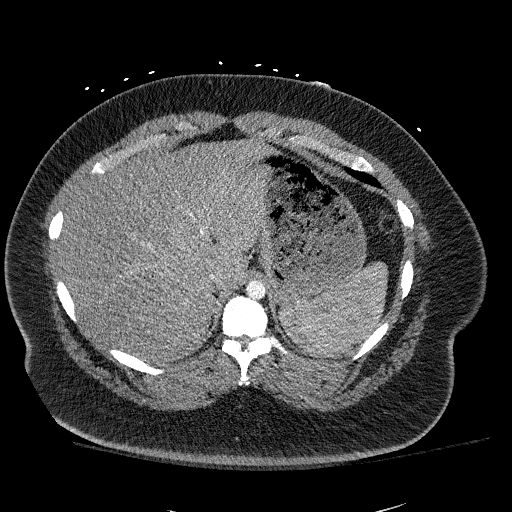
[im 183/244  soft-tissue]
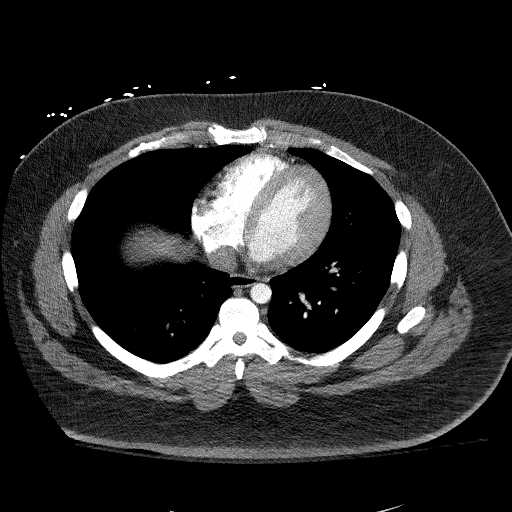
[im 207/244  soft-tissue]
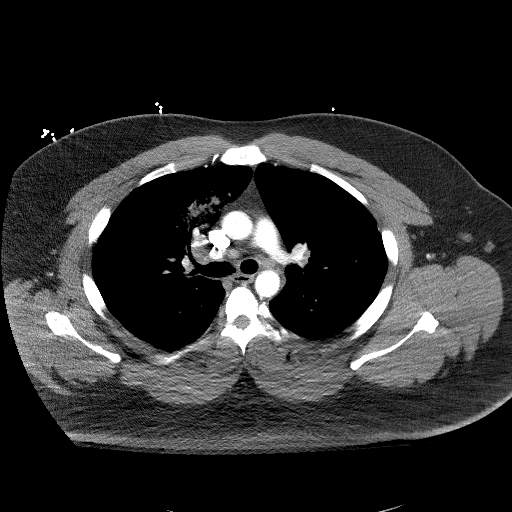
[im 207/244  bone]
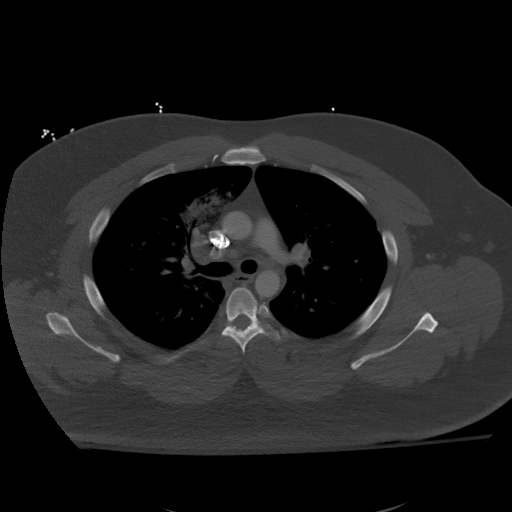
[im 231/244  soft-tissue]
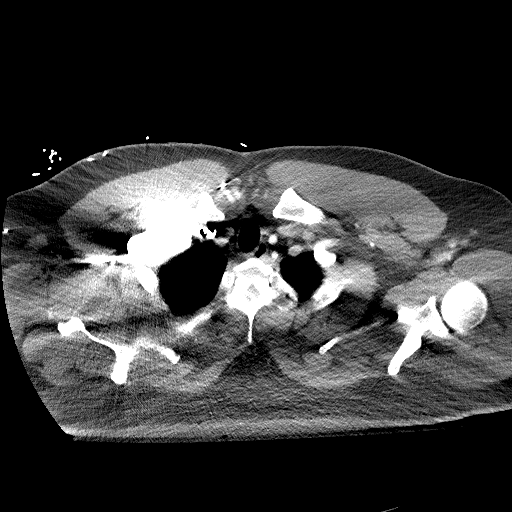

[Series 8: coronals · coronal · 0.99mm/px · 3 of 174 slices shown]
[im 44/174  soft-tissue]
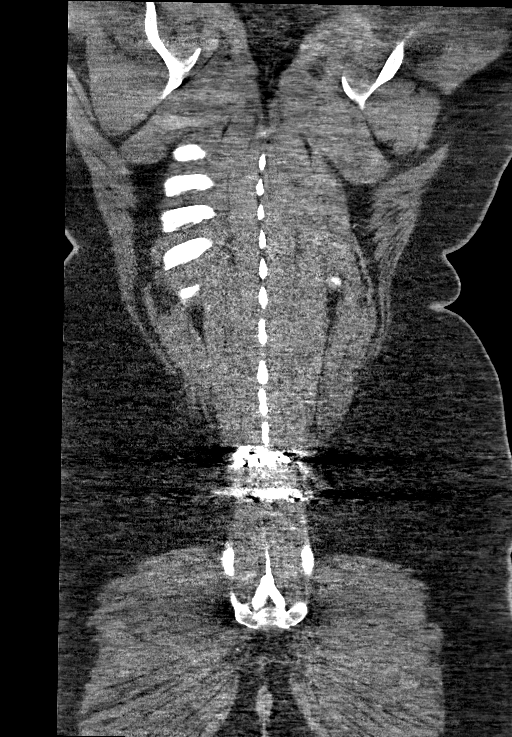
[im 87/174  soft-tissue]
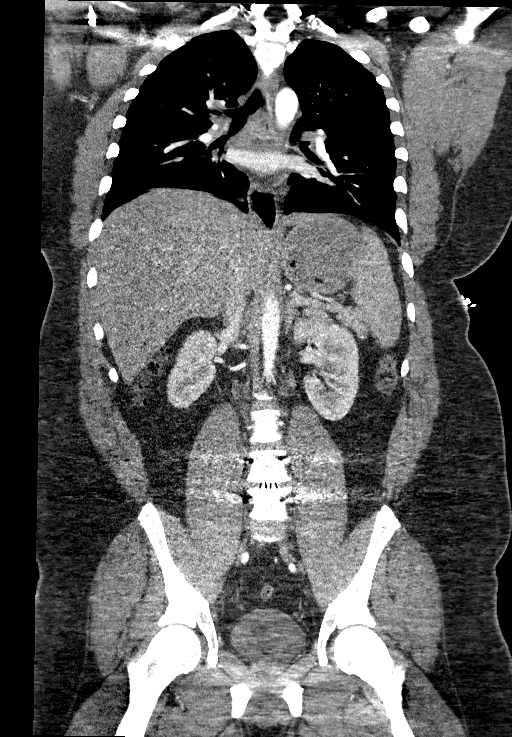
[im 130/174  soft-tissue]
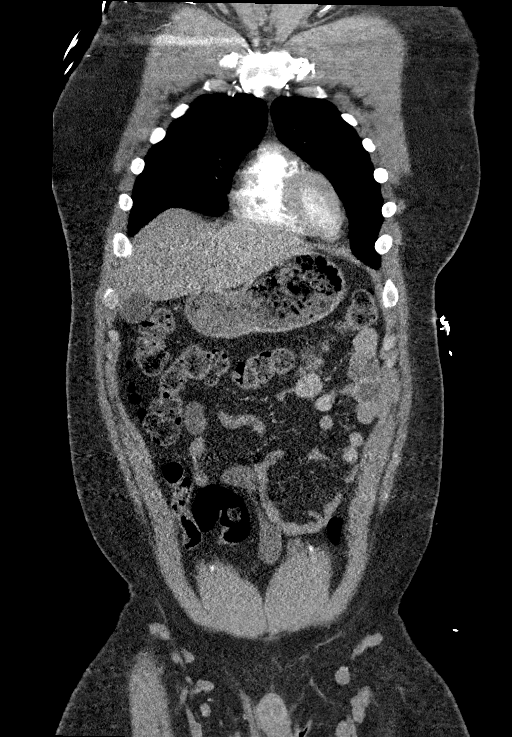

[13 of 46 positions shown; findings below may reference images not displayed]

Multidetector CT imaging through the chest, abdomen and pelvis was
performed using the standard protocol during bolus administration of
intravenous contrast. Multiplanar reconstructed images and MIPs were
obtained and reviewed to evaluate the vascular anatomy.

RADIATION DOSE REDUCTION: This exam was performed according to the
departmental dose-optimization program which includes automated
exposure control, adjustment of the mA and/or kV according to
patient size and/or use of iterative reconstruction technique.

CONTRAST:  100mL OMNIPAQUE IOHEXOL 350 MG/ML SOLN
FINDINGS: CTA CHEST FINDINGS

Cardiovascular: Noncontrast CT through the chest demonstrates no
evidence of aortic hematoma. Preferential opacification of the
thoracic aorta. No evidence of aortic dissection or aneurysm. Three
vessel arch. Central pulmonary vasculature is within normal limits.
No central filling defects. Heart size is normal. No pericardial
effusion.

Mediastinum/Nodes: Multiple mildly prominent mediastinal and right
hilar lymph nodes. Reference nodes include 10 mm pretracheal node
(series 6, image 34) and 14 mm right hilar node (series 6, image
40). Borderline enlarged left hilar lymph node measuring 9 mm
(series 6, image 49). No axillary lymphadenopathy. The thyroid,
trachea, and esophagus are within normal limits.

Lungs/Pleura: Airspace consolidation within the medial aspect of the
apical segment of the right upper lobe. Additional patchy
consolidation within the posterior segment of the right upper lobe.
Minimal ground-glass opacity within the posterior basal segment of
the left lower lobe. No pleural effusion or pneumothorax.

Musculoskeletal: No chest wall abnormality. No acute or significant
osseous findings.

Review of the MIP images confirms the above findings.

CTA ABDOMEN AND PELVIS FINDINGS

VASCULAR

Aorta: Normal caliber aorta without aneurysm, dissection, vasculitis
or significant stenosis.

Celiac: Patent without evidence of aneurysm, dissection, vasculitis
or significant stenosis.

SMA: Patent without evidence of aneurysm, dissection, vasculitis or
significant stenosis.

Renals: Both renal arteries are patent without evidence of aneurysm,
dissection, vasculitis, fibromuscular dysplasia or significant
stenosis. There are 2 right and 1 left renal arteries.

IMA: Patent without evidence of aneurysm, dissection, vasculitis or
significant stenosis.

Inflow: Patent without evidence of aneurysm, dissection, vasculitis
or significant stenosis.

Veins: No obvious venous abnormality within the limitations of this
arterial phase study.

Review of the MIP images confirms the above findings.

NON-VASCULAR

Hepatobiliary: No focal liver abnormality is seen. No gallstones,
gallbladder wall thickening, or biliary dilatation.

Pancreas: Unremarkable. No pancreatic ductal dilatation or
surrounding inflammatory changes.

Spleen: Normal in size without focal abnormality. A small splenule
is noted along the anterior margin of the spleen.

Adrenals/Urinary Tract: Unremarkable adrenal glands. Kidneys enhance
symmetrically without focal lesion, stone, or hydronephrosis.
Ureters are nondilated. Urinary bladder appears unremarkable.

Stomach/Bowel: Stomach is within normal limits. Appendix appears
normal (series 6, image 178). No evidence of bowel wall thickening,
distention, or inflammatory changes.

Lymphatic: No abdominopelvic lymphadenopathy. Mildly enlarged left
inguinal lymph node measuring 1.7 cm short axis (series 6, image
242). Borderline enlarged right inguinal lymph node measuring 10 mm
(series 6, image 228).

Reproductive: Prostate is unremarkable.

Other: No free fluid. No abdominopelvic fluid collection. No
pneumoperitoneum. No abdominal wall hernia.

Musculoskeletal: Prior L4-5 posterior and interbody fusion. No acute
osseous abnormality.

Review of the MIP images confirms the above findings.
IMPRESSION: 1. No evidence of aortic dissection or aneurysm.
2. Findings compatible with multifocal pneumonia, most confluent
within the right upper lobe.
3. Mildly prominent mediastinal and right hilar lymph nodes, likely
reactive.
4. No acute findings within the abdomen or pelvis.
5. Mildly enlarged bilateral inguinal lymph nodes, nonspecific and
possibly reactive.

## 2022-08-21 ENCOUNTER — Emergency Department (HOSPITAL_BASED_OUTPATIENT_CLINIC_OR_DEPARTMENT_OTHER): Payer: 59

## 2022-08-21 ENCOUNTER — Other Ambulatory Visit: Payer: Self-pay

## 2022-08-21 ENCOUNTER — Emergency Department (HOSPITAL_BASED_OUTPATIENT_CLINIC_OR_DEPARTMENT_OTHER)
Admission: EM | Admit: 2022-08-21 | Discharge: 2022-08-21 | Disposition: A | Discharge status: Home / Self Care | Payer: 59 | Attending: Emergency Medicine | Admitting: Emergency Medicine

## 2022-08-21 DIAGNOSIS — Z7951 Long term (current) use of inhaled steroids: Secondary | ICD-10-CM | POA: Insufficient documentation

## 2022-08-21 DIAGNOSIS — H60502 Unspecified acute noninfective otitis externa, left ear: Secondary | ICD-10-CM | POA: Diagnosis not present

## 2022-08-21 DIAGNOSIS — J45909 Unspecified asthma, uncomplicated: Secondary | ICD-10-CM | POA: Diagnosis not present

## 2022-08-21 DIAGNOSIS — R42 Dizziness and giddiness: Secondary | ICD-10-CM | POA: Diagnosis present

## 2022-08-21 DIAGNOSIS — R079 Chest pain, unspecified: Secondary | ICD-10-CM | POA: Insufficient documentation

## 2022-08-21 DIAGNOSIS — H66004 Acute suppurative otitis media without spontaneous rupture of ear drum, recurrent, right ear: Secondary | ICD-10-CM | POA: Diagnosis not present

## 2022-08-21 DIAGNOSIS — I10 Essential (primary) hypertension: Secondary | ICD-10-CM | POA: Insufficient documentation

## 2022-08-21 LAB — DIFFERENTIAL
Abs Immature Granulocytes: 0.03 10*3/uL (ref 0.00–0.07)
Basophils Absolute: 0 10*3/uL (ref 0.0–0.1)
Basophils Relative: 0 %
Eosinophils Absolute: 0.2 10*3/uL (ref 0.0–0.5)
Eosinophils Relative: 2 %
Immature Granulocytes: 0 %
Lymphocytes Relative: 25 %
Lymphs Abs: 2.3 10*3/uL (ref 0.7–4.0)
Monocytes Absolute: 0.4 10*3/uL (ref 0.1–1.0)
Monocytes Relative: 5 %
Neutro Abs: 6.2 10*3/uL (ref 1.7–7.7)
Neutrophils Relative %: 68 %

## 2022-08-21 LAB — COMPREHENSIVE METABOLIC PANEL
ALT: 25 U/L (ref 0–44)
AST: 19 U/L (ref 15–41)
Albumin: 3.9 g/dL (ref 3.5–5.0)
Alkaline Phosphatase: 59 U/L (ref 38–126)
Anion gap: 11 (ref 5–15)
BUN: 12 mg/dL (ref 6–20)
CO2: 27 mmol/L (ref 22–32)
Calcium: 9.3 mg/dL (ref 8.9–10.3)
Chloride: 100 mmol/L (ref 98–111)
Creatinine, Ser: 1.14 mg/dL (ref 0.61–1.24)
GFR, Estimated: >60 mL/min (ref >60)
Glucose, Bld: 126 mg/dL — ABNORMAL HIGH (ref 70–99)
Potassium: 4.3 mmol/L (ref 3.5–5.1)
Sodium: 138 mmol/L (ref 135–145)
Total Bilirubin: 0.4 mg/dL (ref 0.3–1.2)
Total Protein: 8.1 g/dL (ref 6.5–8.1)

## 2022-08-21 LAB — CBC
HCT: 45.1 % (ref 39.0–52.0)
Hemoglobin: 15.1 g/dL (ref 13.0–17.0)
MCH: 27.6 pg (ref 26.0–34.0)
MCHC: 33.5 g/dL (ref 30.0–36.0)
MCV: 82.3 fL (ref 80.0–100.0)
Platelets: 346 10*3/uL (ref 150–400)
RBC: 5.48 MIL/uL (ref 4.22–5.81)
RDW: 13.2 % (ref 11.5–15.5)
WBC: 9 10*3/uL (ref 4.0–10.5)
nRBC: 0 % (ref 0.0–0.2)

## 2022-08-21 LAB — TROPONIN I (HIGH SENSITIVITY): Troponin I (High Sensitivity): 3 ng/L (ref <18)

## 2022-08-21 LAB — APTT: aPTT: 28 seconds (ref 24–36)

## 2022-08-21 LAB — PROTIME-INR
INR: 1 (ref 0.8–1.2)
Prothrombin Time: 13.3 seconds (ref 11.4–15.2)

## 2022-08-21 LAB — ETHANOL: Alcohol, Ethyl (B): <10 mg/dL (ref <10)

## 2022-08-21 MED ORDER — SODIUM CHLORIDE 0.9 % IV BOLUS
1000.0000 mL | Freq: Once | INTRAVENOUS | Status: AC
  Administered 2022-08-21: 1000 mL via INTRAVENOUS

## 2022-08-21 MED ORDER — MECLIZINE HCL 25 MG PO TABS
25.0000 mg | ORAL_TABLET | Freq: Once | ORAL | Status: AC
  Administered 2022-08-21: 25 mg via ORAL
  Filled 2022-08-21: qty 1

## 2022-08-21 MED ORDER — OFLOXACIN 0.3 % OP SOLN: OPHTHALMIC | 0 refills | Status: AC

## 2022-08-21 MED ORDER — AMOXICILLIN-POT CLAVULANATE 875-125 MG PO TABS: 1.0000 | ORAL_TABLET | Freq: Two times a day (BID) | ORAL | 0 refills | Status: AC

## 2022-08-21 MED ORDER — METHYLPREDNISOLONE 4 MG PO TBPK: ORAL_TABLET | ORAL | 0 refills | Status: AC

## 2022-08-21 MED ORDER — MECLIZINE HCL 25 MG PO TABS: 25.0000 mg | ORAL_TABLET | Freq: Three times a day (TID) | ORAL | 0 refills | Status: AC | PRN

## 2022-08-21 NOTE — ED Provider Notes (Signed)
[place] [facility] Provider Note   CSN: [id number] Arrival date & time: 08/21/22  1212     History  Chief Complaint  Patient presents with   Dizziness    Darren Oconnor is a 32 y.o. male who presents emergency department with multiple complaints.  Patient reports yesterday he was weed whacking in his dad's yard when he had onset of racing heart, numbness and tingling of his face and arms feeling a sensation of heaviness.  He felt dizzy and off balance.  He reports that he felt a little bit like he was going to pass out.  He states that today he still feels a little bit dizzy and called his PCP who told him he could be having a heart attack.  He states that today he has some sharp chest pain.  He also reports that he still feels like his arms are heavy.  Patient reports that he had a concussion about a month ago and since that time has had decreased hearing on the right side.  He also has some ear pain on the right side.   Dizziness      Home Medications Prior to Admission medications   Medication Sig Start Date End Date Taking? Authorizing Provider  albuterol (VENTOLIN HFA) 108 (90 Base) MCG/ACT inhaler Inhale into the lungs. 03/25/21   [provider]  buPROPion (WELLBUTRIN XL) 150 MG 24 hr tablet Take by mouth. 06/25/22 06/25/23  [provider]  famotidine (PEPCID) 20 MG tablet Take 1 tablet (20 mg total) by mouth 2 (two) times daily. 07/05/22   Ernie Avena, MD  Fluticasone-Umeclidin-Vilant (TRELEGY ELLIPTA) 200-62.5-25 MCG/ACT AEPB Inhale into the lungs. 08/08/21   [provider]  pantoprazole (PROTONIX) 40 MG tablet Take 1 tablet (40 mg total) by mouth daily. 07/05/22   Ernie Avena, MD  Vitamin D, Ergocalciferol, (DRISDOL) 1.25 MG (50000 UNIT) CAPS capsule Take 1 capsule by mouth once a week. 05/14/22 05/14/23  [provider]      Allergies    Fish-derived products and Shellfish allergy    Review of  Systems   Review of Systems  Neurological:  Positive for dizziness.    Physical Exam Updated Vital Signs BP (!) 162/93 (BP Location: Left Arm)   Pulse 100   Temp 98.5 F (36.9 C) (Oral)   Resp 20   Ht 6\' 5"  (1.956 m)   Wt (!) 179.2 kg   SpO2 99%   BMI 46.84 kg/m  Physical Exam Vitals and nursing note reviewed.  Constitutional:      General: He is not in acute distress.    Appearance: He is well-developed. He is not diaphoretic.  HENT:     Head: Normocephalic and atraumatic.     Right Ear: No mastoid tenderness. Tympanic membrane is injected and bulging.     Ears:     Comments: Canal swollen on the left and erythematous, left TM is injected Eyes:     General: No scleral icterus.    Conjunctiva/sclera: Conjunctivae normal.  Cardiovascular:     Rate and Rhythm: Normal rate and regular rhythm.     Heart sounds: Normal heart sounds.  Pulmonary:     Effort: Pulmonary effort is normal. No respiratory distress.     Breath sounds: Normal breath sounds.  Abdominal:     Palpations: Abdomen is soft.     Tenderness: There is no abdominal tenderness.  Musculoskeletal:     Cervical back: Normal range of motion  and neck supple.  Skin:    General: Skin is warm and dry.  Neurological:     Mental Status: He is alert.     GCS: GCS eye subscore is 4. GCS verbal subscore is 5. GCS motor subscore is 6.     Cranial Nerves: Cranial nerves 2-12 are intact.     Motor: Motor function is intact.     Gait: Gait is intact.     Comments: Speech is clear and goal oriented, follows commands Major Cranial nerves without deficit, no facial droop Normal strength in upper and lower extremities bilaterally including dorsiflexion and plantar flexion, strong and equal grip strength Sensation normal to light and sharp touch Moves extremities without ataxia, coordination intact Normal finger to nose and rapid alternating movements Neg romberg, no pronator drift Normal gait Normal heel-shin and  balance   Psychiatric:        Behavior: Behavior normal.     ED Results / Procedures / Treatments   Labs (all labs ordered are listed, but only abnormal results are displayed) Labs Reviewed  COMPREHENSIVE METABOLIC PANEL - Abnormal; Notable for the following components:      Result Value   Glucose, Bld 126 (*)    All other components within normal limits  PROTIME-INR  APTT  CBC  DIFFERENTIAL  ETHANOL  TROPONIN I (HIGH SENSITIVITY)  TROPONIN I (HIGH SENSITIVITY)    EKG EKG Interpretation Date/Time:  Thursday August 21 2022 12:25:11 EDT Ventricular Rate:  93 PR Interval:  140 QRS Duration:  96 QT Interval:  327 QTC Calculation: 407 R Axis:   -15  Text Interpretation: Sinus rhythm Borderline left axis deviation Borderline T abnormalities, inferior leads Baseline wander in lead(s) V6 Confirmed by Fulton Reek [phone]) on 08/21/2022 12:27:15 PM  Radiology DG Chest 2 View  Result Date: 08/21/2022 CLINICAL DATA:  Chest pain EXAM: CHEST - 2 VIEW COMPARISON:  07/05/2022 and older FINDINGS: No consolidation, pneumothorax or effusion. No edema. Normal cardiopericardial silhouette. IMPRESSION: No acute cardiopulmonary disease Electronically Signed   By: Karen Kays M.D.   On: 08/21/2022 14:02   CT HEAD WO CONTRAST  Result Date: 08/21/2022 CLINICAL DATA:  Vertigo, central EXAM: CT HEAD WITHOUT CONTRAST TECHNIQUE: Contiguous axial images were obtained from the base of the skull through the vertex without intravenous contrast. RADIATION DOSE REDUCTION: This exam was performed according to the departmental dose-optimization program which includes automated exposure control, adjustment of the mA and/or kV according to patient size and/or use of iterative reconstruction technique. COMPARISON:  CT Head 01/24/22 FINDINGS: Brain: No evidence of acute infarction, hemorrhage, hydrocephalus, extra-axial collection or mass lesion/mass effect. Vascular: No hyperdense vessel or unexpected calcification.  Skull: Normal. Negative for fracture or focal lesion. Sinuses/Orbits: Complete opacification of the right-sided mastoid air cells with a right-sided middle ear effusion. No evidence of left-sided mastoid or middle ear effusion. Frothy secretions in the right maxillary sinus can be seen in the setting of acute sinusitis. Orbits are unremarkable Other: None. IMPRESSION: 1. No acute intracranial abnormality. 2. Complete opacification of the right-sided mastoid air cells with a right-sided middle ear effusion. Correlate for signs and symptoms of otomastoiditis. 3. Frothy secretions in the right maxillary sinus can be seen in the setting of acute sinusitis. Electronically Signed   By: Lorenza Cambridge M.D.   On: 08/21/2022 13:09    Procedures Procedures    Medications Ordered in ED Medications  meclizine (ANTIVERT) tablet 25 mg (25 mg Oral Given 08/21/22 1359)    ED Course/  Medical Decision Making/ A&P                                 Medical Decision Making This patient presents to the ED for concern of multiple complaints including dizziness, chest pain, near syncope, ear pain, this involves an extensive number of treatment options, and is a complaint that carries with it a high risk of complications and morbidity.  Vertigo, panic, arrhythmia, acs, cva less likely  Co morbidities:       has a past medical history of Asthma, Back pain, Bipolar disorder (HCC), Headache(784.0), and Hypertension. IDD   Social Determinants of Health:       SDOH Screenings Food Insecurity: Medium Risk (06/24/2022)     Received from [facility], [facility] Transportation Needs: Unmet Transportation Needs (06/24/2022)     Received from [facility], [facility] Utilities: Low Risk  (06/24/2022)     Received from [facility], [facility] Social Connections: Unknown (02/04/2022)     Received from [facility] Tobacco Use: Low Risk  (07/10/2022)     Received from [facility]   Additional  history:  {Additional history obtained from emr   Lab Tests:  I Ordered, and personally interpreted labs.  The pertinent results include:   Glucose of 126 Troponin negative x 2, remainder of labs are within normal limits  Imaging Studies:  I ordered imaging studies including CT head and two-view chest x-ray I independently visualized and interpreted imaging which showed opacification of the mastoid cells on the right, negative chest x-ray  I agree with the radiologist interpretation  Clinical evidence of mastoiditis on physical examination  Cardiac Monitoring/ECG:       The patient was maintained on a cardiac monitor.  I personally viewed and interpreted the cardiac monitored which showed an underlying rhythm of: Sinus rhythm  Medicines ordered and prescription drug management:  I ordered medication including Medications meclizine (ANTIVERT) tablet 25 mg (25 mg Oral Given 08/21/22 1359) sodium chloride 0.9 % bolus 1,000 mL (0 mLs Intravenous Stopped 08/21/22 1709) for dizziness Reevaluation of the patient after these medicines showed that the patient resolved I have reviewed the patients home medicines and have made adjustments as needed  Test Considered:       MRI of the brain however this does not appear to be CVA and given his ear infection to have a high suspicion for peripheral cause of his vertiginous symptoms  Critical Interventions:         Consultations Obtained:   Problem List / ED Course:       (R42) Vertigo  (primary encounter diagnosis)  (H66.004) Recurrent acute suppurative otitis media of right ear without spontaneous rupture of tympanic membrane  (H60.502) Acute otitis externa of left ear, unspecified type   MDM: Seen and shared visit with Dr. Wallace Cullens.  He has a normal neurologic exam.  He has bilateral ear infections and both otitis media and externa with decreased hearing and no external evidence of acute mastoiditis.  Patient's vertiginous symptoms  resolved with meclizine.  Plan on treatment with ofloxacin eardrops and oral Augmentin along with prednisone and closing.  I doubt any other emergent cause such as CVA or ACS.  Discussed outpatient follow-up.  Patient has a appointment with ENT in 1 week.  Discussed reasons to seek immediate medical care.   Dispostion:  After consideration of the diagnostic results and the patients response to treatment, I feel  that the patent would benefit from discharge.    Amount and/or Complexity of Data Reviewed Labs: ordered. Radiology: ordered.  Risk Prescription drug management.           Final Clinical Impression(s) / ED Diagnoses Final diagnoses:  None    Rx / DC Orders ED Discharge Orders     None         Arthor Captain, PA-C 08/22/22 0809    Sloan Leiter, DO 08/23/22 1615

## 2022-08-21 NOTE — Discharge Instructions (Signed)
Contact a health care provider if: Your medicines don't help or make your vertigo worse. You get new symptoms. You have a fever. You have nausea or vomiting. Your family or friends spot any changes in how you're acting. A part of your body goes numb. You feel tingling and prickling in a part of your body. You get very bad headaches. Get help right away if: You're always dizzy or you faint. You have a stiff neck. You have trouble moving or speaking. Your hands, arms, or legs feel weak. Your hearing or eyesight changes. These symptoms may be an emergency. Call 911 right away. Do not wait to see if the symptoms will go away. Do not drive yourself to the hospital.

## 2022-08-21 NOTE — ED Triage Notes (Signed)
Patient presents to ED via POV from home. Here with dizziness and left hand "heaviness". Reports hand "heaviness" began at 1000 yesterday. Endorses chest pain as well. Diaphoretic.

## 2023-09-21 ENCOUNTER — Encounter (HOSPITAL_BASED_OUTPATIENT_CLINIC_OR_DEPARTMENT_OTHER): Payer: Self-pay | Admitting: Emergency Medicine

## 2023-09-21 ENCOUNTER — Other Ambulatory Visit: Payer: Self-pay

## 2023-09-21 ENCOUNTER — Emergency Department (HOSPITAL_BASED_OUTPATIENT_CLINIC_OR_DEPARTMENT_OTHER)
Admission: EM | Admit: 2023-09-21 | Discharge: 2023-09-21 | Disposition: A | Discharge status: Home / Self Care | Attending: Emergency Medicine | Admitting: Emergency Medicine

## 2023-09-21 ENCOUNTER — Emergency Department (HOSPITAL_BASED_OUTPATIENT_CLINIC_OR_DEPARTMENT_OTHER)

## 2023-09-21 DIAGNOSIS — Z79899 Other long term (current) drug therapy: Secondary | ICD-10-CM | POA: Insufficient documentation

## 2023-09-21 DIAGNOSIS — R519 Headache, unspecified: Secondary | ICD-10-CM | POA: Diagnosis present

## 2023-09-21 DIAGNOSIS — I10 Essential (primary) hypertension: Secondary | ICD-10-CM | POA: Insufficient documentation

## 2023-09-21 DIAGNOSIS — M542 Cervicalgia: Secondary | ICD-10-CM | POA: Diagnosis not present

## 2023-09-21 DIAGNOSIS — S0990XA Unspecified injury of head, initial encounter: Secondary | ICD-10-CM

## 2023-09-21 DIAGNOSIS — S161XXA Strain of muscle, fascia and tendon at neck level, initial encounter: Secondary | ICD-10-CM

## 2023-09-21 NOTE — Discharge Instructions (Signed)
 Take ibuprofen  600 mg every 6 hours as needed for pain.  Follow-up with primary doctor if symptoms not improving in the next week.
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
